# Patient Record
Sex: Male | Born: 1945 | Race: White | Hispanic: No | Marital: Married | Smoking: Current every day smoker
Health system: Southern US, Community
[De-identification: ages and names within clinical notes are randomized; demographics above are authoritative.]

## PROBLEM LIST (undated history)

## (undated) DIAGNOSIS — Z955 Presence of coronary angioplasty implant and graft: Secondary | ICD-10-CM

## (undated) DIAGNOSIS — M549 Dorsalgia, unspecified: Secondary | ICD-10-CM

## (undated) DIAGNOSIS — E876 Hypokalemia: Secondary | ICD-10-CM

## (undated) DIAGNOSIS — J181 Lobar pneumonia, unspecified organism: Secondary | ICD-10-CM

## (undated) DIAGNOSIS — S0990XA Unspecified injury of head, initial encounter: Secondary | ICD-10-CM

## (undated) DIAGNOSIS — R0600 Dyspnea, unspecified: Secondary | ICD-10-CM

## (undated) DIAGNOSIS — F419 Anxiety disorder, unspecified: Secondary | ICD-10-CM

## (undated) DIAGNOSIS — I25119 Atherosclerotic heart disease of native coronary artery with unspecified angina pectoris: Secondary | ICD-10-CM

## (undated) DIAGNOSIS — I2510 Atherosclerotic heart disease of native coronary artery without angina pectoris: Secondary | ICD-10-CM

## (undated) DIAGNOSIS — Z720 Tobacco use: Secondary | ICD-10-CM

## (undated) DIAGNOSIS — G8929 Other chronic pain: Secondary | ICD-10-CM

## (undated) DIAGNOSIS — R55 Syncope and collapse: Secondary | ICD-10-CM

## (undated) DIAGNOSIS — G44011 Episodic cluster headache, intractable: Secondary | ICD-10-CM

## (undated) DIAGNOSIS — Z9119 Patient's noncompliance with other medical treatment and regimen: Secondary | ICD-10-CM

## (undated) DIAGNOSIS — I429 Cardiomyopathy, unspecified: Secondary | ICD-10-CM

## (undated) DIAGNOSIS — I5020 Unspecified systolic (congestive) heart failure: Secondary | ICD-10-CM

## (undated) DIAGNOSIS — I471 Supraventricular tachycardia: Secondary | ICD-10-CM

## (undated) DIAGNOSIS — G4700 Insomnia, unspecified: Secondary | ICD-10-CM

## (undated) DIAGNOSIS — K219 Gastro-esophageal reflux disease without esophagitis: Secondary | ICD-10-CM

## (undated) DIAGNOSIS — E7800 Pure hypercholesterolemia, unspecified: Secondary | ICD-10-CM

## (undated) DIAGNOSIS — G4459 Other complicated headache syndrome: Secondary | ICD-10-CM

## (undated) DIAGNOSIS — E785 Hyperlipidemia, unspecified: Secondary | ICD-10-CM

## (undated) DIAGNOSIS — I509 Heart failure, unspecified: Secondary | ICD-10-CM

## (undated) DIAGNOSIS — I444 Left anterior fascicular block: Secondary | ICD-10-CM

## (undated) DIAGNOSIS — I10 Essential (primary) hypertension: Secondary | ICD-10-CM

## (undated) HISTORY — DX: Presence of coronary angioplasty implant and graft: Z95.5

## (undated) HISTORY — DX: Heart failure, unspecified: I50.9

## (undated) HISTORY — DX: Syncope and collapse: R55

## (undated) HISTORY — DX: Essential (primary) hypertension: I10

## (undated) HISTORY — DX: Supraventricular tachycardia: I47.1

## (undated) HISTORY — DX: Anxiety disorder, unspecified: F41.9

## (undated) HISTORY — DX: Cardiomyopathy, unspecified: I42.9

## (undated) HISTORY — DX: Atherosclerotic heart disease of native coronary artery with unspecified angina pectoris: I25.119

## (undated) HISTORY — PX: BACK SURGERY: SHX140

## (undated) HISTORY — DX: Other complicated headache syndrome: G44.59

## (undated) HISTORY — PX: CATARACT EXTRACTION: SUR2

## (undated) HISTORY — DX: Left anterior fascicular block: I44.4

## (undated) HISTORY — DX: Hyperlipidemia, unspecified: E78.5

## (undated) HISTORY — DX: Patient's noncompliance with other medical treatment and regimen: Z91.19

## (undated) HISTORY — DX: Lobar pneumonia, unspecified organism: J18.1

## (undated) HISTORY — DX: Hypokalemia: E87.6

## (undated) HISTORY — DX: Gastro-esophageal reflux disease without esophagitis: K21.9

## (undated) HISTORY — DX: Unspecified injury of head, initial encounter: S09.90XA

## (undated) HISTORY — DX: Insomnia, unspecified: G47.00

## (undated) HISTORY — PX: SHOULDER SURGERY: SHX246

## (undated) HISTORY — PX: KNEE SURGERY: SHX244

## (undated) HISTORY — DX: Episodic cluster headache, intractable: G44.011

## (undated) HISTORY — DX: Atherosclerotic heart disease of native coronary artery without angina pectoris: I25.10

---

## 2002-02-13 ENCOUNTER — Inpatient Hospital Stay (HOSPITAL_COMMUNITY): Admission: AD | Admit: 2002-02-13 | Discharge: 2002-02-14 | Payer: Self-pay | Admitting: Cardiology

## 2005-01-28 ENCOUNTER — Ambulatory Visit: Payer: Self-pay | Admitting: Family Medicine

## 2015-01-06 DIAGNOSIS — H25011 Cortical age-related cataract, right eye: Secondary | ICD-10-CM | POA: Diagnosis not present

## 2015-01-06 DIAGNOSIS — H2511 Age-related nuclear cataract, right eye: Secondary | ICD-10-CM | POA: Diagnosis not present

## 2015-01-07 DIAGNOSIS — H269 Unspecified cataract: Secondary | ICD-10-CM | POA: Diagnosis not present

## 2015-01-07 DIAGNOSIS — E785 Hyperlipidemia, unspecified: Secondary | ICD-10-CM | POA: Diagnosis not present

## 2015-01-07 DIAGNOSIS — I119 Hypertensive heart disease without heart failure: Secondary | ICD-10-CM | POA: Diagnosis not present

## 2015-01-07 DIAGNOSIS — Z720 Tobacco use: Secondary | ICD-10-CM | POA: Diagnosis not present

## 2015-01-07 DIAGNOSIS — Z01818 Encounter for other preprocedural examination: Secondary | ICD-10-CM | POA: Diagnosis not present

## 2015-01-07 DIAGNOSIS — I2510 Atherosclerotic heart disease of native coronary artery without angina pectoris: Secondary | ICD-10-CM | POA: Diagnosis not present

## 2015-01-07 DIAGNOSIS — F17210 Nicotine dependence, cigarettes, uncomplicated: Secondary | ICD-10-CM | POA: Diagnosis not present

## 2015-01-13 DIAGNOSIS — R8290 Unspecified abnormal findings in urine: Secondary | ICD-10-CM | POA: Diagnosis not present

## 2015-01-13 DIAGNOSIS — Z720 Tobacco use: Secondary | ICD-10-CM | POA: Diagnosis not present

## 2015-01-20 DIAGNOSIS — F17210 Nicotine dependence, cigarettes, uncomplicated: Secondary | ICD-10-CM | POA: Diagnosis not present

## 2015-01-20 DIAGNOSIS — I119 Hypertensive heart disease without heart failure: Secondary | ICD-10-CM | POA: Diagnosis not present

## 2015-01-20 DIAGNOSIS — E785 Hyperlipidemia, unspecified: Secondary | ICD-10-CM | POA: Diagnosis not present

## 2015-01-20 DIAGNOSIS — I2510 Atherosclerotic heart disease of native coronary artery without angina pectoris: Secondary | ICD-10-CM | POA: Diagnosis not present

## 2015-01-23 DIAGNOSIS — H25811 Combined forms of age-related cataract, right eye: Secondary | ICD-10-CM | POA: Diagnosis not present

## 2015-01-23 DIAGNOSIS — H2511 Age-related nuclear cataract, right eye: Secondary | ICD-10-CM | POA: Diagnosis not present

## 2015-04-08 DIAGNOSIS — M7989 Other specified soft tissue disorders: Secondary | ICD-10-CM | POA: Diagnosis not present

## 2015-04-08 DIAGNOSIS — M79641 Pain in right hand: Secondary | ICD-10-CM | POA: Diagnosis not present

## 2015-07-03 DIAGNOSIS — Z0000 Encounter for general adult medical examination without abnormal findings: Secondary | ICD-10-CM | POA: Diagnosis not present

## 2015-07-03 DIAGNOSIS — R079 Chest pain, unspecified: Secondary | ICD-10-CM | POA: Diagnosis not present

## 2015-07-03 DIAGNOSIS — I119 Hypertensive heart disease without heart failure: Secondary | ICD-10-CM | POA: Diagnosis not present

## 2015-07-03 DIAGNOSIS — E785 Hyperlipidemia, unspecified: Secondary | ICD-10-CM | POA: Diagnosis not present

## 2015-07-03 DIAGNOSIS — Z136 Encounter for screening for cardiovascular disorders: Secondary | ICD-10-CM | POA: Diagnosis not present

## 2015-07-03 DIAGNOSIS — Z720 Tobacco use: Secondary | ICD-10-CM | POA: Diagnosis not present

## 2015-07-03 DIAGNOSIS — I2510 Atherosclerotic heart disease of native coronary artery without angina pectoris: Secondary | ICD-10-CM | POA: Diagnosis not present

## 2015-07-03 DIAGNOSIS — F17218 Nicotine dependence, cigarettes, with other nicotine-induced disorders: Secondary | ICD-10-CM | POA: Diagnosis not present

## 2015-07-03 DIAGNOSIS — Z125 Encounter for screening for malignant neoplasm of prostate: Secondary | ICD-10-CM | POA: Diagnosis not present

## 2015-07-03 DIAGNOSIS — G4762 Sleep related leg cramps: Secondary | ICD-10-CM | POA: Diagnosis not present

## 2015-07-08 DIAGNOSIS — Z1211 Encounter for screening for malignant neoplasm of colon: Secondary | ICD-10-CM | POA: Diagnosis not present

## 2015-07-22 DIAGNOSIS — S80862A Insect bite (nonvenomous), left lower leg, initial encounter: Secondary | ICD-10-CM | POA: Diagnosis not present

## 2015-07-22 DIAGNOSIS — W57XXXA Bitten or stung by nonvenomous insect and other nonvenomous arthropods, initial encounter: Secondary | ICD-10-CM | POA: Diagnosis not present

## 2015-07-22 DIAGNOSIS — S80861A Insect bite (nonvenomous), right lower leg, initial encounter: Secondary | ICD-10-CM | POA: Diagnosis not present

## 2015-07-24 DIAGNOSIS — R079 Chest pain, unspecified: Secondary | ICD-10-CM | POA: Diagnosis not present

## 2015-07-31 DIAGNOSIS — M79605 Pain in left leg: Secondary | ICD-10-CM | POA: Diagnosis not present

## 2015-07-31 DIAGNOSIS — M79604 Pain in right leg: Secondary | ICD-10-CM | POA: Diagnosis not present

## 2015-07-31 DIAGNOSIS — G4762 Sleep related leg cramps: Secondary | ICD-10-CM | POA: Diagnosis not present

## 2015-09-09 ENCOUNTER — Emergency Department (HOSPITAL_COMMUNITY): Payer: Medicare Other

## 2015-09-09 ENCOUNTER — Emergency Department (HOSPITAL_COMMUNITY)
Admission: EM | Admit: 2015-09-09 | Discharge: 2015-09-09 | Disposition: A | Discharge status: Home / Self Care | Payer: Medicare Other | Attending: Emergency Medicine | Admitting: Emergency Medicine

## 2015-09-09 ENCOUNTER — Encounter (HOSPITAL_COMMUNITY): Payer: Self-pay | Admitting: Emergency Medicine

## 2015-09-09 DIAGNOSIS — Z720 Tobacco use: Secondary | ICD-10-CM | POA: Insufficient documentation

## 2015-09-09 DIAGNOSIS — R0789 Other chest pain: Secondary | ICD-10-CM | POA: Diagnosis not present

## 2015-09-09 DIAGNOSIS — Z7982 Long term (current) use of aspirin: Secondary | ICD-10-CM | POA: Diagnosis not present

## 2015-09-09 DIAGNOSIS — J209 Acute bronchitis, unspecified: Secondary | ICD-10-CM | POA: Diagnosis not present

## 2015-09-09 DIAGNOSIS — J40 Bronchitis, not specified as acute or chronic: Secondary | ICD-10-CM

## 2015-09-09 DIAGNOSIS — R079 Chest pain, unspecified: Secondary | ICD-10-CM | POA: Diagnosis not present

## 2015-09-09 DIAGNOSIS — R05 Cough: Secondary | ICD-10-CM | POA: Diagnosis not present

## 2015-09-09 DIAGNOSIS — I2510 Atherosclerotic heart disease of native coronary artery without angina pectoris: Secondary | ICD-10-CM | POA: Diagnosis not present

## 2015-09-09 DIAGNOSIS — F17200 Nicotine dependence, unspecified, uncomplicated: Secondary | ICD-10-CM | POA: Diagnosis not present

## 2015-09-09 DIAGNOSIS — R0602 Shortness of breath: Secondary | ICD-10-CM | POA: Diagnosis not present

## 2015-09-09 HISTORY — DX: Atherosclerotic heart disease of native coronary artery without angina pectoris: I25.10

## 2015-09-09 LAB — BASIC METABOLIC PANEL
Anion gap: 9 (ref 5–15)
BUN: 9 mg/dL (ref 6–20)
CALCIUM: 9.1 mg/dL (ref 8.9–10.3)
CO2: 24 mmol/L (ref 22–32)
CREATININE: 0.92 mg/dL (ref 0.61–1.24)
Chloride: 105 mmol/L (ref 101–111)
GFR calc Af Amer: >60 mL/min (ref >60)
GLUCOSE: 129 mg/dL — AB (ref 65–99)
POTASSIUM: 3.9 mmol/L (ref 3.5–5.1)
SODIUM: 138 mmol/L (ref 135–145)

## 2015-09-09 LAB — CBC WITH DIFFERENTIAL/PLATELET
BASOS ABS: 0 10*3/uL (ref 0.0–0.1)
Basophils Relative: 0 %
EOS ABS: 0.1 10*3/uL (ref 0.0–0.7)
EOS PCT: 2 %
HCT: 45 % (ref 39.0–52.0)
Hemoglobin: 15.7 g/dL (ref 13.0–17.0)
LYMPHS PCT: 24 %
Lymphs Abs: 1.7 10*3/uL (ref 0.7–4.0)
MCH: 31.8 pg (ref 26.0–34.0)
MCHC: 34.9 g/dL (ref 30.0–36.0)
MCV: 91.3 fL (ref 78.0–100.0)
MONO ABS: 0.4 10*3/uL (ref 0.1–1.0)
Monocytes Relative: 5 %
Neutro Abs: 4.9 10*3/uL (ref 1.7–7.7)
Neutrophils Relative %: 69 %
PLATELETS: 164 10*3/uL (ref 150–400)
RBC: 4.93 MIL/uL (ref 4.22–5.81)
RDW: 13.7 % (ref 11.5–15.5)
WBC: 7.1 10*3/uL (ref 4.0–10.5)

## 2015-09-09 LAB — I-STAT TROPONIN, ED
TROPONIN I, POC: 0 ng/mL (ref 0.00–0.08)
TROPONIN I, POC: 0 ng/mL (ref 0.00–0.08)

## 2015-09-09 MED ORDER — AZITHROMYCIN 250 MG PO TABS: 250.0000 mg | ORAL_TABLET | Freq: Every day | ORAL | Status: DC

## 2015-09-09 MED ORDER — ALBUTEROL SULFATE HFA 108 (90 BASE) MCG/ACT IN AERS
1.0000 | INHALATION_SPRAY | Freq: Four times a day (QID) | RESPIRATORY_TRACT | Status: DC | PRN
  Administered 2015-09-09: 2 via RESPIRATORY_TRACT
  Filled 2015-09-09: qty 6.7

## 2015-09-09 NOTE — ED Notes (Signed)
Pt sts generalized CP x 3 days worse with cough; pt sts some SOB

## 2015-09-09 NOTE — ED Notes (Signed)
Pt. Left with all belongings and refused wheelchair. Discharge instructions were reviewed and all questions were answered.  

## 2015-09-09 NOTE — Discharge Instructions (Signed)
Bronchitis  Bronchitis is inflammation of the airways that extend from the windpipe into the lungs (bronchi). The inflammation often causes mucus to develop. This leads to a cough, which is the most common symptom of bronchitis.   In acute bronchitis, the condition usually develops suddenly and goes away over time, usually in a couple weeks. Smoking, allergies, and asthma can make bronchitis worse. Repeated episodes of bronchitis may cause further lung problems.  CAUSES  Acute bronchitis is most often caused by the same virus that causes a cold. The virus can spread from person to person (contagious) through coughing, sneezing, and touching contaminated objects.  SIGNS AND SYMPTOMS  Cough.  Fever.  Coughing up mucus.  Body aches.  Chest congestion.  Chills.  Shortness of breath.  Sore throat.  DIAGNOSIS  Acute bronchitis is usually diagnosed through a physical exam. Your health care provider will also ask you questions about your medical history. Tests, such as chest X-rays, are sometimes done to rule out other conditions.  TREATMENT  Acute bronchitis usually goes away in a couple weeks. Oftentimes, no medical treatment is necessary. Medicines are sometimes given for relief of fever or cough. Antibiotic medicines are usually not needed but may be prescribed in certain situations. In some cases, an inhaler may be recommended to help reduce shortness of breath and control the cough. A cool mist vaporizer may also be used to help thin bronchial secretions and make it easier to clear the chest.  HOME CARE INSTRUCTIONS  Get plenty of rest.  Drink enough fluids to keep your urine clear or pale yellow (unless you have a medical condition that requires fluid restriction). Increasing fluids may help thin your respiratory secretions (sputum) and reduce chest congestion, and it will prevent dehydration.  Take medicines only as directed by your health care provider.  If you were prescribed an antibiotic  medicine, finish it all even if you start to feel better.  Avoid smoking and secondhand smoke. Exposure to cigarette smoke or irritating chemicals will make bronchitis worse. If you are a smoker, consider using nicotine gum or skin patches to help control withdrawal symptoms. Quitting smoking will help your lungs heal faster.  Reduce the chances of another bout of acute bronchitis by washing your hands frequently, avoiding people with cold symptoms, and trying not to touch your hands to your mouth, nose, or eyes.  Keep all follow-up visits as directed by your health care provider.  SEEK MEDICAL CARE IF:  Your symptoms do not improve after 1 week of treatment.  SEEK IMMEDIATE MEDICAL CARE IF:  You develop an increased fever or chills.  You have chest pain.  You have severe shortness of breath.  You have bloody sputum.  You develop dehydration.  You faint or repeatedly feel like you are going to pass out.  You develop repeated vomiting.  You develop a severe headache. MAKE SURE YOU:  Understand these instructions.  Will watch your condition.  Will get help right away if you are not doing well or get worse. Document Released: 01/13/2005 Document Revised: 04/22/2014 Document Reviewed: 05/29/2013  [facility] Patient Information 2015 [place], [place]. This information is not intended to replace advice given to you by your health care provider. Make sure you discuss any questions you have with your health care provider.

## 2015-09-09 NOTE — ED Provider Notes (Signed)
CSN: [id number]     Arrival date & time 09/09/15  1048 History   First MD Initiated Contact with Patient 09/09/15 1351     Chief Complaint  Patient presents with  . Chest Pain     (Consider location/radiation/quality/duration/timing/severity/associated sxs/prior Treatment) HPI   Craig Russell is a 69 y.o. male with PMH significant for CAD, cardiac cath (2003) with stent placement, and recent stress test (07/24/15) who presents with substernal gradually worsening constant sharp, pressure-like chest pain that began this morning when he woke up.  He states he has had a cough for the past couple of weeks with increased sputum production. Exertion, movement, and cough make it worse.  Nothing makes it better.  No history of CVA.  He smokes 1.5 ppd and has a 99 pack year history.  He currently takes pravastatin and ASA 81 mg QD.  He has SL nitroglycerin; however, he did not take any this morning.  He has a PCP in [place].  He is unsure of his stress test results (07/24/15).   Past Medical History  Diagnosis Date  . Coronary artery disease    History reviewed. No pertinent past surgical history. History reviewed. No pertinent family history. Social History  Substance Use Topics  . Smoking status: Current Every Day Smoker  . Smokeless tobacco: None  . Alcohol Use: No    Review of Systems All other systems negative unless otherwise stated in HPI    Allergies  Review of patient's allergies indicates no known allergies.  Home Medications   Prior to Admission medications   Medication Sig Start Date End Date Taking? Authorizing Provider  aspirin 81 MG chewable tablet Chew 81 mg by mouth daily.   Yes Historical Provider, MD  nitroGLYCERIN (NITROSTAT) 0.4 MG SL tablet Place 0.4 mg under the tongue every 5 (five) minutes as needed for chest pain.   Yes Historical Provider, MD  pravastatin (PRAVACHOL) 10 MG tablet Take 10 mg by mouth at bedtime.   Yes Historical Provider, MD   BP 130/79 mmHg   Pulse 57  Temp(Src) 97.8 F (36.6 C) (Oral)  Resp 14  SpO2 96% Physical Exam  Constitutional: He is oriented to person, place, and time. He appears well-developed and well-nourished.  HENT:  Head: Normocephalic and atraumatic.  Mouth/Throat: Oropharynx is clear and moist.  Eyes: Pupils are equal, round, and reactive to light.  Neck: Normal range of motion. Neck supple.  Cardiovascular: Normal rate, regular rhythm and normal heart sounds.   No murmur heard. Pulmonary/Chest: Effort normal and breath sounds normal. No respiratory distress. He has no wheezes. He has no rales.    Abdominal: Soft. Bowel sounds are normal. He exhibits no distension. There is no tenderness.  Musculoskeletal: Normal range of motion.  Lymphadenopathy:    He has no cervical adenopathy.  Neurological: He is alert and oriented to person, place, and time.  Skin: Skin is warm and dry.  Psychiatric: He has a normal mood and affect. His behavior is normal.    ED Course  Procedures (including critical care time) Labs Review Labs Reviewed  BASIC METABOLIC PANEL - Abnormal; Notable for the following:    Glucose, Bld 129 (*)    All other components within normal limits  CBC WITH DIFFERENTIAL/PLATELET  Randolm Idol, ED    Imaging Review Dg Chest 2 View  09/09/2015   CLINICAL DATA:  Chest pain, shortness of breath, smoker. Nausea, cough.  EXAM: CHEST  2 VIEW  COMPARISON:  01/07/2015  FINDINGS: The  heart size and mediastinal contours are within normal limits. Both lungs are clear. The visualized skeletal structures are unremarkable.  IMPRESSION: No active cardiopulmonary disease.   Electronically Signed   By: Rolm Baptise M.D.   On: 09/09/2015 11:58   I have personally reviewed and evaluated these images and lab results as part of my medical decision-making.   EKG Interpretation None      MDM   Final diagnoses:  None    Patient presents with gradually worsening chest pain.  VSS, patient appears  nontoxic, NAD.  On exam, TTP along anterior chest wall and sternum.  Lungs CTAB.  Heart sounds nl.    Imaging includes CXR which shows no active cardiopulmonary disease.  Labs include CBC, troponin, and BMP.  EKG is unchanged from previous.     Concern for cardiac etiology vs noncardiac chest pain vs bronchitis.  HEART score 4.  Will perform serial troponins.  I-Stat troponin at 11:43 AM negative.  Will check again at 2:45 PM.  Second troponin is negative.  Suspect bronchitis.  Will d/c home with [employer] if serial troponin negative and follow up with his cardiologist this week. Pt stable for d/c.  Discussed return precautions and supportive care.  Patient acknowledges and agrees with the above plan.  Case has been discussed with and seen by Dr. Maryan Rued who agrees with the above plan for discharge.     [name], PA-C 09/09/15 1619  [name], MD 09/13/15 2210

## 2015-09-11 DIAGNOSIS — H35033 Hypertensive retinopathy, bilateral: Secondary | ICD-10-CM | POA: Diagnosis not present

## 2015-11-27 DIAGNOSIS — M1990 Unspecified osteoarthritis, unspecified site: Secondary | ICD-10-CM | POA: Diagnosis not present

## 2015-11-27 DIAGNOSIS — M7989 Other specified soft tissue disorders: Secondary | ICD-10-CM | POA: Diagnosis not present

## 2015-11-27 DIAGNOSIS — Z23 Encounter for immunization: Secondary | ICD-10-CM | POA: Diagnosis not present

## 2015-11-27 DIAGNOSIS — M79641 Pain in right hand: Secondary | ICD-10-CM | POA: Diagnosis not present

## 2016-01-05 DIAGNOSIS — G4762 Sleep related leg cramps: Secondary | ICD-10-CM | POA: Diagnosis not present

## 2016-01-05 DIAGNOSIS — E785 Hyperlipidemia, unspecified: Secondary | ICD-10-CM | POA: Diagnosis not present

## 2016-01-05 DIAGNOSIS — I119 Hypertensive heart disease without heart failure: Secondary | ICD-10-CM | POA: Diagnosis not present

## 2016-01-05 DIAGNOSIS — F17218 Nicotine dependence, cigarettes, with other nicotine-induced disorders: Secondary | ICD-10-CM | POA: Diagnosis not present

## 2016-04-06 DIAGNOSIS — M19041 Primary osteoarthritis, right hand: Secondary | ICD-10-CM | POA: Diagnosis not present

## 2016-04-06 DIAGNOSIS — M79641 Pain in right hand: Secondary | ICD-10-CM | POA: Diagnosis not present

## 2016-04-19 DIAGNOSIS — I5020 Unspecified systolic (congestive) heart failure: Secondary | ICD-10-CM

## 2016-04-19 HISTORY — DX: Unspecified systolic (congestive) heart failure: I50.20

## 2016-05-09 ENCOUNTER — Observation Stay (HOSPITAL_COMMUNITY)
Admission: EM | Admit: 2016-05-09 | Discharge: 2016-05-11 | Disposition: A | Discharge status: Home / Self Care | Payer: Medicare Other | Attending: Internal Medicine | Admitting: Internal Medicine

## 2016-05-09 ENCOUNTER — Encounter (HOSPITAL_COMMUNITY): Payer: Self-pay

## 2016-05-09 ENCOUNTER — Emergency Department (HOSPITAL_COMMUNITY): Payer: Medicare Other

## 2016-05-09 DIAGNOSIS — R079 Chest pain, unspecified: Secondary | ICD-10-CM | POA: Diagnosis not present

## 2016-05-09 DIAGNOSIS — Z7982 Long term (current) use of aspirin: Secondary | ICD-10-CM | POA: Diagnosis not present

## 2016-05-09 DIAGNOSIS — E7800 Pure hypercholesterolemia, unspecified: Secondary | ICD-10-CM | POA: Diagnosis not present

## 2016-05-09 DIAGNOSIS — Z8249 Family history of ischemic heart disease and other diseases of the circulatory system: Secondary | ICD-10-CM | POA: Diagnosis not present

## 2016-05-09 DIAGNOSIS — F17210 Nicotine dependence, cigarettes, uncomplicated: Secondary | ICD-10-CM | POA: Diagnosis not present

## 2016-05-09 DIAGNOSIS — R1013 Epigastric pain: Secondary | ICD-10-CM | POA: Diagnosis not present

## 2016-05-09 DIAGNOSIS — R0789 Other chest pain: Principal | ICD-10-CM | POA: Insufficient documentation

## 2016-05-09 DIAGNOSIS — F17200 Nicotine dependence, unspecified, uncomplicated: Secondary | ICD-10-CM | POA: Diagnosis not present

## 2016-05-09 DIAGNOSIS — Z9114 Patient's other noncompliance with medication regimen: Secondary | ICD-10-CM | POA: Insufficient documentation

## 2016-05-09 DIAGNOSIS — I2510 Atherosclerotic heart disease of native coronary artery without angina pectoris: Secondary | ICD-10-CM

## 2016-05-09 DIAGNOSIS — R001 Bradycardia, unspecified: Secondary | ICD-10-CM | POA: Diagnosis not present

## 2016-05-09 DIAGNOSIS — J410 Simple chronic bronchitis: Secondary | ICD-10-CM | POA: Insufficient documentation

## 2016-05-09 DIAGNOSIS — I10 Essential (primary) hypertension: Secondary | ICD-10-CM | POA: Insufficient documentation

## 2016-05-09 DIAGNOSIS — Z955 Presence of coronary angioplasty implant and graft: Secondary | ICD-10-CM

## 2016-05-09 DIAGNOSIS — R51 Headache: Secondary | ICD-10-CM | POA: Diagnosis not present

## 2016-05-09 DIAGNOSIS — I25119 Atherosclerotic heart disease of native coronary artery with unspecified angina pectoris: Secondary | ICD-10-CM | POA: Diagnosis not present

## 2016-05-09 DIAGNOSIS — Z79899 Other long term (current) drug therapy: Secondary | ICD-10-CM | POA: Insufficient documentation

## 2016-05-09 DIAGNOSIS — Z66 Do not resuscitate: Secondary | ICD-10-CM | POA: Insufficient documentation

## 2016-05-09 DIAGNOSIS — E785 Hyperlipidemia, unspecified: Secondary | ICD-10-CM

## 2016-05-09 DIAGNOSIS — Z720 Tobacco use: Secondary | ICD-10-CM

## 2016-05-09 HISTORY — DX: Pure hypercholesterolemia, unspecified: E78.00

## 2016-05-09 HISTORY — DX: Tobacco use: Z72.0

## 2016-05-09 HISTORY — DX: Presence of coronary angioplasty implant and graft: Z95.5

## 2016-05-09 HISTORY — DX: Hyperlipidemia, unspecified: E78.5

## 2016-05-09 HISTORY — DX: Atherosclerotic heart disease of native coronary artery without angina pectoris: I25.10

## 2016-05-09 LAB — CBC
HCT: 44.6 % (ref 39.0–52.0)
Hemoglobin: 14.9 g/dL (ref 13.0–17.0)
MCH: 30 pg (ref 26.0–34.0)
MCHC: 33.4 g/dL (ref 30.0–36.0)
MCV: 89.9 fL (ref 78.0–100.0)
PLATELETS: 188 10*3/uL (ref 150–400)
RBC: 4.96 MIL/uL (ref 4.22–5.81)
RDW: 13.4 % (ref 11.5–15.5)
WBC: 5.4 10*3/uL (ref 4.0–10.5)

## 2016-05-09 LAB — BASIC METABOLIC PANEL
Anion gap: 8 (ref 5–15)
BUN: 7 mg/dL (ref 6–20)
CALCIUM: 9.1 mg/dL (ref 8.9–10.3)
CO2: 27 mmol/L (ref 22–32)
CREATININE: 0.98 mg/dL (ref 0.61–1.24)
Chloride: 104 mmol/L (ref 101–111)
GFR calc non Af Amer: >60 mL/min (ref >60)
Glucose, Bld: 87 mg/dL (ref 65–99)
Potassium: 3.6 mmol/L (ref 3.5–5.1)
Sodium: 139 mmol/L (ref 135–145)

## 2016-05-09 LAB — I-STAT TROPONIN, ED: TROPONIN I, POC: 0 ng/mL (ref 0.00–0.08)

## 2016-05-09 MED ORDER — NICOTINE 21 MG/24HR TD PT24
21.0000 mg | MEDICATED_PATCH | Freq: Every day | TRANSDERMAL | Status: DC
  Administered 2016-05-10 – 2016-05-11 (×3): 21 mg via TRANSDERMAL
  Filled 2016-05-09 (×3): qty 1

## 2016-05-09 MED ORDER — ATORVASTATIN CALCIUM 40 MG PO TABS
40.0000 mg | ORAL_TABLET | Freq: Every day | ORAL | Status: DC
  Administered 2016-05-10 – 2016-05-11 (×3): 40 mg via ORAL
  Filled 2016-05-09 (×3): qty 1

## 2016-05-09 MED ORDER — LISINOPRIL 10 MG PO TABS
5.0000 mg | ORAL_TABLET | Freq: Every day | ORAL | Status: DC
  Administered 2016-05-10 – 2016-05-11 (×2): 5 mg via ORAL
  Filled 2016-05-09 (×2): qty 1

## 2016-05-09 MED ORDER — NITROGLYCERIN 0.4 MG SL SUBL: 0.4000 mg | SUBLINGUAL_TABLET | SUBLINGUAL | Status: DC | PRN

## 2016-05-09 MED ORDER — ATORVASTATIN CALCIUM 40 MG PO TABS: 40.0000 mg | ORAL_TABLET | Freq: Every day | ORAL | Status: DC

## 2016-05-09 NOTE — H&P (Signed)
Date: 05/09/2016               Patient Name:  Craig Russell MRN: [id number]  DOB: Jun 08, 1946 Age / Sex: 70 y.o., male   PCP: No primary care provider on file.         Medical Service: Internal Medicine Teaching Service         Attending Physician: Dr. Gilles Chiquito, MD    First Contact: Dr. Jule Ser Pager: [phone]  Second Contact: Dr. Dellia Nims Pager: [phone]       After Hours (After 5p/  First Contact Pager: [phone]  weekends / holidays): Second Contact Pager: [phone]   Chief Complaint: Chest pain  History of Present Illness: Craig Russell is a 70 year old gentleman with PMH of CAD s/p cath 2 stents in 2002, HLD, and significant smoking history ( 1.5-2 PPD since age 49) who presents with chest pain. Patient states he was in his normal state of health until 3 AM yesterday morning when he was woken from sleep with a sharp constant right sided chest pain which radiated diagonally down to his left upper abdomen and up to his left chest. He reports tingling down his left arm and a burning sensation at his left neck. He had no associated diaphoresis, focal weakness, nausea, vomiting, SOB, headache, change in vision, loss of consciousness or radiation of his pain to the back. He feels his pain was worse with exertion. He took aleve for his pain which resolved after about 40 minutes. Patient felt well this morning, able to attend church and returned home. He says this pain returned while he was sitting on the couch and decided to come to the ED to be evaluated. He notes a decrease in energy of the last 2-3 months, but no limitation to ADLs. He has no pain when seen in the ED.  He carries a bottle of nitroglycerin in his pocket, but does not take it as it gave him a headache once about 10 years ago. He says he has not taken any medications in at least 6 months, including his home aspirin and statin. He had a similar episode for which he was seen in the ED in September 2016, but no other  chest pain since his cardiac cath. He has a 120 pack year smoking history and is resistant to quitting. He has a chronic smoker's cough. 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 He reports a history of CHF in his brother. He has a PCP in [place].  Meds: Current Facility-Administered Medications  Medication Dose Route Frequency Provider Last Rate Last Dose  . atorvastatin (LIPITOR) tablet 40 mg  40 mg Oral q1800 Juliet Rude, MD      . Derrill Memo ON 05/10/2016] lisinopril (PRINIVIL,ZESTRIL) tablet 5 mg  5 mg Oral Daily Zada Finders, MD      . nicotine (NICODERM CQ - dosed in mg/24 hours) patch 21 mg  21 mg Transdermal Daily Carly J Rivet, MD      . nitroGLYCERIN (NITROSTAT) SL tablet 0.4 mg  0.4 mg Sublingual Q5 min PRN Juliet Rude, MD       Current Outpatient Prescriptions  Medication Sig Dispense Refill  . aspirin 81 MG chewable tablet Chew 81 mg by mouth daily. Reported on 05/09/2016    . azithromycin (ZITHROMAX) 250 MG tablet Take 1 tablet (250 mg total) by mouth daily. Take first 2 tablets together, then 1 every day  until finished. 6 tablet 0  . busPIRone (BUSPAR) 15 MG tablet Take 15 mg by mouth 2 (two) times daily. Reported on 05/09/2016    . nitroGLYCERIN (NITROSTAT) 0.4 MG SL tablet Place 0.4 mg under the tongue every 5 (five) minutes as needed for chest pain. Reported on 05/09/2016    . pravastatin (PRAVACHOL) 10 MG tablet Take 10 mg by mouth at bedtime. Reported on 05/09/2016      Allergies: Allergies as of 05/09/2016  . (No Known Allergies)   Past Medical History  Diagnosis Date  . Coronary artery disease     s/p 2 stents in 2002  . High cholesterol   . Tobacco abuse    History reviewed. No pertinent past surgical history. Family History  Problem Relation Age of Onset  . Cancer Sister     "brain tumors"  . Heart failure Brother    Social History   Social History  . Marital Status: Married    Spouse Name: N/A  . Number of  Children: N/A  . Years of Education: N/A   Occupational History  . Not on file.   Social History Main Topics  . Smoking status: Current Every Day Smoker -- 2.00 packs/day for 60 years    Types: Cigarettes  . Smokeless tobacco: Not on file  . Alcohol Use: No  . Drug Use: No  . Sexual Activity: Not on file   Other Topics Concern  . 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       Decreased energy for 2 months  Eyes:       Occasional blurry vision  Respiratory: Positive for cough and sputum production. Negative for hemoptysis and shortness of breath.   Cardiovascular: Positive for chest pain. Negative for palpitations, orthopnea and leg swelling.  Gastrointestinal: Negative for heartburn, nausea, vomiting, abdominal pain, diarrhea and constipation.  Genitourinary: Negative for dysuria.  Musculoskeletal: Positive for neck pain. Negative for back pain and falls.       Burning left sided neck pain. Muscle cramping bilateral lower extremities  Neurological: Positive for dizziness and tingling. Negative for sensory change, focal weakness, loss of consciousness, weakness and headaches.       Tingling down left arm     Physical Exam: Blood pressure 131/78, pulse 63, temperature 97.6 F (36.4 C), temperature source Oral, resp. rate 14, height 5\' 7"  (1.702 m), weight 160 lb (72.576 kg), SpO2 97 %. Physical Exam  Constitutional: He is oriented to person, place, and time. He appears well-developed and well-nourished. No distress.  HENT:  Head: Normocephalic and atraumatic.  Mouth/Throat: Oropharynx is clear and moist.  Eyes: EOM are normal.  Neck: Normal range of motion. Normal carotid pulses present. Carotid bruit is not present.  Cardiovascular: Regular rhythm and intact distal pulses.   Bradycardia present.   Pulmonary/Chest: Effort normal. No respiratory distress. He exhibits no tenderness.  Distant breath sounds  Abdominal: Soft. Bowel sounds are normal. He exhibits no distension.  Mild LUQ tenderness   Musculoskeletal: Normal range of motion. He exhibits no edema or tenderness.  Neurological: He is alert and oriented to person, place, and time. He has normal strength. He displays no tremor. No cranial nerve deficit or sensory deficit. He exhibits normal muscle tone. Coordination normal.  Skin: Skin  is warm. He is not diaphoretic.  Psychiatric: He has a normal mood and affect.     Lab results: Basic Metabolic Panel:  Recent Labs  05/09/16 1721  NA 139  K 3.6  CL 104  CO2 27  GLUCOSE 87  BUN 7  CREATININE 0.98  CALCIUM 9.1   Liver Function Tests: No results for input(s): AST, ALT, ALKPHOS, BILITOT, PROT, ALBUMIN in the last 72 hours. No results for input(s): LIPASE, AMYLASE in the last 72 hours. No results for input(s): AMMONIA in the last 72 hours. CBC:  Recent Labs  05/09/16 1721  WBC 5.4  HGB 14.9  HCT 44.6  MCV 89.9  PLT 188   Cardiac Enzymes: No results for input(s): CKTOTAL, CKMB, CKMBINDEX, TROPONINI in the last 72 hours. BNP: No results for input(s): PROBNP in the last 72 hours. D-Dimer: No results for input(s): DDIMER in the last 72 hours. CBG: No results for input(s): GLUCAP in the last 72 hours. Hemoglobin A1C: No results for input(s): HGBA1C in the last 72 hours. Fasting Lipid Panel: No results for input(s): CHOL, HDL, LDLCALC, TRIG, CHOLHDL, LDLDIRECT in the last 72 hours. Thyroid Function Tests: No results for input(s): TSH, T4TOTAL, FREET4, T3FREE, THYROIDAB in the last 72 hours. Anemia Panel: No results for input(s): VITAMINB12, FOLATE, FERRITIN, TIBC, IRON, RETICCTPCT in the last 72 hours. Coagulation: No results for input(s): LABPROT, INR in the last 72 hours. Urine Drug Screen: Drugs of Abuse  No results found for:  LABOPIA, COCAINSCRNUR, LABBENZ, AMPHETMU, THCU, LABBARB  Alcohol Level: No results for input(s): ETH in the last 72 hours. Urinalysis: No results for input(s): COLORURINE, LABSPEC, PHURINE, GLUCOSEU, HGBUR, BILIRUBINUR, KETONESUR, PROTEINUR, UROBILINOGEN, NITRITE, LEUKOCYTESUR in the last 72 hours.  Invalid input(s): APPERANCEUR  Imaging results:  Dg Chest 2 View  05/09/2016  CLINICAL DATA:  A left chest pain starting at 3 a.m. EXAM: CHEST  2 VIEW COMPARISON:  09/09/2015 FINDINGS: Atherosclerotic aortic arch. A heart size within normal limits. A large lung volumes raise the possibility of emphysema. No acute findings. IMPRESSION: 1. Large lung volumes raise the possibility of emphysema. Atherosclerotic aortic arch. No acute findings. Electronically Signed   By: Van Clines M.D.   On: 05/09/2016 17:55    Other results: EKG: NSR, rate 66, LAFB, flattening of t-wave in III compared to prior, st elevation.  Assessment & Plan by Problem: Principal Problem:   Chest pain Active Problems:   CAD (coronary artery disease)   Tobacco abuse   Hyperlipidemia   S/P coronary artery stent placement  70 year old gentleman with PMH of CAD s/p cath 2 stents in 2002, HLD, and significant smoking history ( 1.5-2 PPD since age 57) who presents with chest pain.  Chest Pain: Patient with history of CAD s/p stenting, significant smoking history, HLD, and chest pain at rest. He has mixed typical and atypical symptoms and with his caridac history, risk factors, and non-adherence to medications, he warrants admission for chest pain rule out. Initial point of care troponin is negative. EKG without acute ischemic changes. CXR demonstrates atherosclerosis of the aortic arch and hyperexpanded lung fields suggestive of underlying COPD. He is currently asymptomatic. We will trend troponins and repeat EKG in the AM. It has been 15 years since his stents were placed and may warrant cardiac catheterization to evaluate  for restenosis versus stress testing. He will also require risk factor modification and medication adherence going forward. Should be on a beta-blocker, but holding for now given his bradycardia. -Trend  Troponins -Aspirin 81 mg daily -Start Atorvastatin 40 mg daily -Start Lisinopril 5 mg daily -Consider beta-blocker -Check Hgb A1C, Lipid panel -Sublingual nitroglycerin prn   CAD s/p stents: Patient reports 2 stents placed in 2002. He is non-adherent to home medications including aspirin and statin. May warrant repeat cardiac cath to evaluate for stenosis. -ASA, statin as above  HLD: Total Cholesterol 187, LDL 114, HDL 58, Triglycerides 78 on 07/03/2015. Non-adherent to home statin. -Start Atorvastatin 40 mg daily  Tobacco use:  Patient with at least a 120 pack year smoking history (1.5-2 PPD since age 54). He is resistant to quitting. Will need continued counseling and assistance to cut back and hopefully quit altogether. -Continue smoking cessation counseling -Nicotine patch   Diet: Heart   DVT ppx: Lovenox   Code: DNR (confirmed by patient)  Dispo: Disposition is deferred at this time, awaiting improvement of current medical problems. Anticipated discharge in approximately 1-2 day(s).   The patient does have a current PCP (No primary care provider on file.) and does not need an [facility] hospital follow-up appointment after discharge.  The patient does not have transportation limitations that hinder transportation to clinic appointments.  Signed: Zada Finders, MD 05/09/2016, 11:02 PM

## 2016-05-09 NOTE — ED Notes (Signed)
Patient states he has a bottle of nitroglycerin that he keeps in pockets at all times but he states that he doesn't take it because it gives him a headache.

## 2016-05-09 NOTE — ED Notes (Signed)
Patient returned from xray.

## 2016-05-09 NOTE — ED Provider Notes (Signed)
CSN: [id number]     Arrival date & time 05/09/16  1640 History   First MD Initiated Contact with Patient 05/09/16 1652     Chief Complaint  Patient presents with  . Chest Pain  . arm pain      (Consider location/radiation/quality/duration/timing/severity/associated sxs/prior Treatment) Patient is a 70 y.o. male presenting with chest pain. The history is provided by the patient, medical records and the spouse. No language interpreter was used.  Chest Pain Pain location:  L chest and R chest Pain quality: sharp   Pain radiates to:  L arm Pain radiates to the back: no   Pain severity:  Moderate Onset quality:  Sudden Duration:  1 day Timing:  Intermittent Progression:  Waxing and waning (Woke patient from sleep at 0400. Took Alleve around 0700 which relieved pain. Pain returned this evening while patient was sitting on the couch. ) Chronicity:  New Context comment:  Woke at 0400 with pain. Denies recent trauma, stress, cocaine or other drug use.  Relieved by:  Nothing Exacerbated by: lifting arms over head. Ineffective treatments: Alleve. Associated symptoms: no abdominal pain, no altered mental status, no back pain, no cough, no diaphoresis, no fever, no headache, no lower extremity edema, no nausea, no palpitations, no shortness of breath and not vomiting   Risk factors: coronary artery disease, high cholesterol (not taking medications), hypertension, male sex and smoking   Risk factors: no diabetes mellitus and no prior DVT/PE     Past Medical History  Diagnosis Date  . Coronary artery disease   . High cholesterol    History reviewed. No pertinent past surgical history. No family history on file. Social History  Substance Use Topics  . Smoking status: Current Every Day Smoker  . Smokeless tobacco: None  . Alcohol Use: No    Review of Systems  Constitutional: Negative for fever and diaphoresis.  HENT: Negative for congestion and rhinorrhea.   Eyes: Negative for visual  disturbance.  Respiratory: Negative for cough and shortness of breath.   Cardiovascular: Positive for chest pain. Negative for palpitations.  Gastrointestinal: Negative for nausea, vomiting and abdominal pain.  Genitourinary: Negative for dysuria and difficulty urinating.  Musculoskeletal: Negative for back pain and neck pain.  Skin: Negative for pallor and rash.  Neurological: Negative for light-headedness and headaches.  Psychiatric/Behavioral: Negative for confusion.      Allergies  Review of patient's allergies indicates no known allergies.  Home Medications   Prior to Admission medications   Medication Sig Start Date End Date Taking? Authorizing Provider  aspirin 81 MG chewable tablet Chew 81 mg by mouth daily.    Historical Provider, MD  azithromycin (ZITHROMAX) 250 MG tablet Take 1 tablet (250 mg total) by mouth daily. Take first 2 tablets together, then 1 every day until finished. 09/09/15   [name], PA-C  nitroGLYCERIN (NITROSTAT) 0.4 MG SL tablet Place 0.4 mg under the tongue every 5 (five) minutes as needed for chest pain.    Historical Provider, MD  pravastatin (PRAVACHOL) 10 MG tablet Take 10 mg by mouth at bedtime.    Historical Provider, MD   BP 145/89 mmHg  Pulse 63  Temp(Src) 97.6 F (36.4 C) (Oral)  Resp 18  Ht 5\' 7"  (1.702 m)  Wt 72.576 kg  BMI 25.05 kg/m2  SpO2 100% Physical Exam  Constitutional: He is oriented to person, place, and time. He appears well-developed and well-nourished.  HENT:  Head: Normocephalic and atraumatic.  Eyes: EOM are normal. Pupils are equal,  round, and reactive to light.  Neck: Normal range of motion. Neck supple.  Cardiovascular: Normal rate, regular rhythm and intact distal pulses.   Pulmonary/Chest: Effort normal. No respiratory distress. He has wheezes (scattered expiratory wheezes).  Abdominal: Soft. He exhibits no distension. There is no tenderness.  Musculoskeletal: Normal range of motion. He exhibits no edema or  tenderness.  Neurological: He is alert and oriented to person, place, and time.  Skin: Skin is warm and dry. No rash noted.  Psychiatric: He has a normal mood and affect.  Nursing note and vitals reviewed.   ED Course  Procedures (including critical care time) Labs Review Labs Reviewed  LIPID PANEL - Abnormal; Notable for the following:    LDL Cholesterol 108 (*)    All other components within normal limits  HEPATIC FUNCTION PANEL - Abnormal; Notable for the following:    Total Protein 6.4 (*)    ALT 13 (*)    All other components within normal limits  BASIC METABOLIC PANEL  CBC  TROPONIN I  TROPONIN I  HEMOGLOBIN A1C  HIV ANTIBODY (ROUTINE TESTING)  HEPATITIS C ANTIBODY (REFLEX)  TROPONIN I  I-STAT TROPOININ, ED    Imaging Review Dg Chest 2 View  05/09/2016  CLINICAL DATA:  A left chest pain starting at 3 a.m. EXAM: CHEST  2 VIEW COMPARISON:  09/09/2015 FINDINGS: Atherosclerotic aortic arch. A heart size within normal limits. A large lung volumes raise the possibility of emphysema. No acute findings. IMPRESSION: 1. Large lung volumes raise the possibility of emphysema. Atherosclerotic aortic arch. No acute findings. Electronically Signed   By: Van Clines M.D.   On: 05/09/2016 17:55   I have personally reviewed and evaluated these images and lab results as part of my medical decision-making.   EKG Interpretation   Date/Time:  Sunday May 09 2016 16:46:47 EDT Ventricular Rate:  25 PR Interval:  138 QRS Duration: 90 QT Interval:  408 QTC Calculation: 431 R Axis:   -74 Text Interpretation:  Normal sinus rhythm Left anterior fascicular block  Nonspecific ST abnormality Abnormal ECG since last tracing no significant  change Confirmed by MILLER  MD, BRIAN ([id number]) on 05/09/2016 4:58:41 PM      MDM   Final diagnoses:  Chest pain, unspecified chest pain type  Coronary artery disease with unspecified angina pectoris  S/P coronary artery stent placement   Patient  is a 70 year old male with history of coronary artery disease status post 2 stents in 2002, hyperlipidemia and significant smoking to presents with chest pain. Patient reports he woke from sleep with chest pain this morning. He took some Aleve at around 7 AM which relieved his pain for a time. However pain returned this afternoon while at rest patient's wife encouraged him to come to the ED to be evaluated. He did not take any aspirin or nitroglycerin prior to arrival. He reports he has a bottle of nitroglycerin at home but as it gave him a headache when he took it about 10 years ago he has not taken it since.  Presentation patient is afebrile, mildly hypertensive and otherwise hemodynamically stable. Currently pain free. No JVD, heart murmur, rales, or lower extremity edema on exam. EKG does not show acute ischemic changes. Chest x-ray demonstrates atherosclerosis of the aortic arch and hyper expanded lungs consistent with possible COPD. Initial troponin is within normal limits. Other labs unremarkable. This patient is reporting both typical and atypical symptoms for ACS. He is overll high risk with a history of coronary  artery disease and medication nonadherence. Plan to admit for ACS rule out. Internal medicine was consulted for admission in triage of patient. Patient will be admitted to the internal medicine teaching service for an ACS rule out.  Patient was seen and discussed with Dr. Sabra Heck, ED attending.      Gibson Ramp, MD 05/10/16 [place], MD 05/10/16 [phone]

## 2016-05-09 NOTE — ED Notes (Signed)
Patient transported to X-ray 

## 2016-05-10 ENCOUNTER — Other Ambulatory Visit: Payer: Self-pay

## 2016-05-10 ENCOUNTER — Encounter (HOSPITAL_COMMUNITY): Payer: Self-pay | Admitting: General Practice

## 2016-05-10 DIAGNOSIS — R079 Chest pain, unspecified: Secondary | ICD-10-CM

## 2016-05-10 DIAGNOSIS — R0789 Other chest pain: Principal | ICD-10-CM

## 2016-05-10 DIAGNOSIS — Z955 Presence of coronary angioplasty implant and graft: Secondary | ICD-10-CM

## 2016-05-10 DIAGNOSIS — I2510 Atherosclerotic heart disease of native coronary artery without angina pectoris: Secondary | ICD-10-CM | POA: Diagnosis not present

## 2016-05-10 DIAGNOSIS — E785 Hyperlipidemia, unspecified: Secondary | ICD-10-CM | POA: Diagnosis not present

## 2016-05-10 DIAGNOSIS — Z720 Tobacco use: Secondary | ICD-10-CM

## 2016-05-10 DIAGNOSIS — F17210 Nicotine dependence, cigarettes, uncomplicated: Secondary | ICD-10-CM

## 2016-05-10 DIAGNOSIS — I25119 Atherosclerotic heart disease of native coronary artery with unspecified angina pectoris: Secondary | ICD-10-CM | POA: Diagnosis not present

## 2016-05-10 LAB — HIV ANTIBODY (ROUTINE TESTING W REFLEX): HIV Screen 4th Generation wRfx: NONREACTIVE

## 2016-05-10 LAB — TROPONIN I
Troponin I: <0.03 ng/mL (ref <0.031)
Troponin I: <0.03 ng/mL (ref <0.031)
Troponin I: <0.03 ng/mL (ref <0.031)

## 2016-05-10 LAB — LIPID PANEL
CHOLESTEROL: 172 mg/dL (ref 0–200)
HDL: 48 mg/dL (ref >40)
LDL Cholesterol: 108 mg/dL — ABNORMAL HIGH (ref 0–99)
Total CHOL/HDL Ratio: 3.6 RATIO
Triglycerides: 78 mg/dL (ref <150)
VLDL: 16 mg/dL (ref 0–40)

## 2016-05-10 LAB — HEPATIC FUNCTION PANEL
ALBUMIN: 3.5 g/dL (ref 3.5–5.0)
ALK PHOS: 75 U/L (ref 38–126)
ALT: 13 U/L — AB (ref 17–63)
AST: 19 U/L (ref 15–41)
Bilirubin, Direct: 0.2 mg/dL (ref 0.1–0.5)
Indirect Bilirubin: 0.4 mg/dL (ref 0.3–0.9)
TOTAL PROTEIN: 6.4 g/dL — AB (ref 6.5–8.1)
Total Bilirubin: 0.6 mg/dL (ref 0.3–1.2)

## 2016-05-10 MED ORDER — NITROGLYCERIN 2 % TD OINT
1.0000 [in_us] | TOPICAL_OINTMENT | Freq: Three times a day (TID) | TRANSDERMAL | Status: DC
  Administered 2016-05-10 (×3): 1 [in_us] via TOPICAL
  Filled 2016-05-10: qty 30

## 2016-05-10 MED ORDER — ASPIRIN 325 MG PO TABS
325.0000 mg | ORAL_TABLET | Freq: Every day | ORAL | Status: DC
  Administered 2016-05-10 – 2016-05-11 (×2): 325 mg via ORAL
  Filled 2016-05-10 (×2): qty 1

## 2016-05-10 MED ORDER — ENOXAPARIN SODIUM 40 MG/0.4ML ~~LOC~~ SOLN
40.0000 mg | Freq: Every day | SUBCUTANEOUS | Status: DC
  Administered 2016-05-10 (×2): 40 mg via SUBCUTANEOUS
  Filled 2016-05-10 (×2): qty 0.4

## 2016-05-10 MED ORDER — MORPHINE SULFATE (PF) 2 MG/ML IV SOLN: 1.0000 mg | INTRAVENOUS | Status: DC | PRN

## 2016-05-10 MED ORDER — SODIUM CHLORIDE 0.9% FLUSH
3.0000 mL | Freq: Two times a day (BID) | INTRAVENOUS | Status: DC
  Administered 2016-05-10 – 2016-05-11 (×4): 3 mL via INTRAVENOUS

## 2016-05-10 MED ORDER — ASPIRIN 81 MG PO CHEW: 81.0000 mg | CHEWABLE_TABLET | Freq: Every day | ORAL | Status: DC

## 2016-05-10 NOTE — Progress Notes (Signed)
Subjective: Patient seen and examined this morning.  No acute events overnight since admission.  Still reporting left sided chest pain that radiates to his left arm.  He denies any tenderness to palpation or worse with movement.  Describes a squeezing, pressure-like feeling that comes and goes.  States his chest pain is a little better with Aleve.  Objective: Vital signs in last 24 hours: Filed Vitals:   05/09/16 2330 05/10/16 0019 05/10/16 0608 05/10/16 1000  BP: 130/77 140/83 135/71 121/69  Pulse: 46 53 57   Temp:  97.8 F (36.6 C) 98.2 F (36.8 C)   TempSrc:  Oral Oral   Resp: 18 18 19    Height:  5\' 7"  (1.702 m)    Weight:  147 lb 4.8 oz (66.815 kg)    SpO2: 96% 98% 95%    Weight change:   Intake/Output Summary (Last 24 hours) at 05/10/16 1128 Last data filed at 05/10/16 0818  Gross per 24 hour  Intake    480 ml  Output   1150 ml  Net   -670 ml   General: resting in bed, pleasant, NAD HEENT: EOMI, no scleral icterus Cardiac: RRR, no rubs, murmurs or gallops Pulm: normal effort, no wheezes but breath sounds are a little distant Abd: soft, nontender, nondistended, BS present Ext: warm and well perfused, no pedal edema Neuro: alert and oriented X3, cranial nerves II-XII grossly intact  Lab Results: Basic Metabolic Panel:  Recent Labs Lab 05/09/16 1721  NA 139  K 3.6  CL 104  CO2 27  GLUCOSE 87  BUN 7  CREATININE 0.98  CALCIUM 9.1   Liver Function Tests:  Recent Labs Lab 05/10/16 0032  AST 19  ALT 13*  ALKPHOS 75  BILITOT 0.6  PROT 6.4*  ALBUMIN 3.5   CBC:  Recent Labs Lab 05/09/16 1721  WBC 5.4  HGB 14.9  HCT 44.6  MCV 89.9  PLT 188   Cardiac Enzymes:  Recent Labs Lab 05/10/16 0032 05/10/16 0229 05/10/16 0817  TROPONINI <0.03 <0.03 <0.03   Fasting Lipid Panel:  Recent Labs Lab 05/10/16 0032  CHOL 172  HDL 48  LDLCALC 108*  TRIG 78  CHOLHDL 3.6    Micro Results: No results found for this or any previous visit (from the  past 240 hour(s)). Studies/Results: Dg Chest 2 View  05/09/2016  CLINICAL DATA:  A left chest pain starting at 3 a.m. EXAM: CHEST  2 VIEW COMPARISON:  09/09/2015 FINDINGS: Atherosclerotic aortic arch. A heart size within normal limits. A large lung volumes raise the possibility of emphysema. No acute findings. IMPRESSION: 1. Large lung volumes raise the possibility of emphysema. Atherosclerotic aortic arch. No acute findings. Electronically Signed   By: Van Clines M.D.   On: 05/09/2016 17:55   Medications: I have reviewed the patient's current medications. Scheduled Meds: . aspirin  325 mg Oral Daily  . atorvastatin  40 mg Oral q1800  . enoxaparin (LOVENOX) injection  40 mg Subcutaneous QHS  . lisinopril  5 mg Oral Daily  . nicotine  21 mg Transdermal Daily  . nitroGLYCERIN  1 inch Topical Q8H  . sodium chloride flush  3 mL Intravenous Q12H   Continuous Infusions:  PRN Meds:.morphine injection, nitroGLYCERIN Assessment/Plan: Principal Problem:   Chest pain Active Problems:   CAD (coronary artery disease)   Tobacco abuse   Hyperlipidemia   S/P coronary artery stent placement  Chest Pain: patient presenting with both typical and atypical features in a patient with known  CAD s/p stents in 2003.  His labs look okay, cardiac enzymes are negative, and EKG is without ischemic changes.  However, considering his ongoing chest pain with exertional onset, we will consult cardiology for their recommendations - appreciate cardiology assistance - stress test in the AM for risk stratification - continue Aspirin 325mg  daily - continue statin - continue lisinopril - holding BB given bradycardia - A1c pending - smoking cessation  - NTG paste Q8H - sublingual NTG prn - morphine 1mg  Q4H prn  CAD: s/p BMS to proximal RCA in 2003.  Non-adherent to medications -Lipid panel with TC 172, HDL 48, LDL 108 -ASA, statin as above  Tobacco use: Patient with at least a 120 pack year smoking  history (1.5-2 PPD since age 55). He is resistant to quitting. Will need continued counseling and assistance to cut back and hopefully quit altogether. -Continue smoking cessation counseling -Nicotine patch  Health maintenance: - checking HIV, HCV  Diet: Heart healthy  DVT ppx: Lovenox  Code: DNR (confirmed by patient)  Dispo: Disposition is deferred at this time, awaiting improvement of current medical problems.  Anticipated discharge in approximately 1-2 day(s).   The patient does not have a current PCP (No primary care provider on file.) and does need an [facility] hospital follow-up appointment after discharge.  The patient does not have transportation limitations that hinder transportation to clinic appointments.  .Services Needed at time of discharge: Y = Yes, Blank = No PT:   OT:   RN:   Equipment:   Other:       Jule Ser, DO 05/10/2016, 11:28 AM

## 2016-05-10 NOTE — Consult Note (Signed)
CARDIOLOGY CONSULT NOTE   Patient ID: Craig Russell MRN: [id number], DOB/AGE: 21-Sep-1946   Admit date: 05/09/2016 Date of Consult: 05/10/2016   Primary Physician: Dr. Quintella Reichert Primary Cardiologist: None  Pt. Profile  Craig Russell is a 70 year old Caucasian male with past medical history of CAD status post stent 2003, HLD, tobacco abuse and relative bradycardia who has not taken his meds for 6 months came in with chest pain.  Problem List  Past Medical History  Diagnosis Date  . Coronary artery disease     s/p 2 stents in 01/2002  . High cholesterol   . Tobacco abuse     History reviewed. No pertinent past surgical history.   Allergies  No Known Allergies  HPI   Craig Russell is a 70 year old Caucasian male with past medical history of CAD s/p stent 2003, HLD, tobacco abuse, and medication noncompliance. He has not seen a medical provider since early 2016. He had a history of CAD and underwent MultiLink Zeta stent 3.0 x 18 mm in proximal RCA on 05/13/2002 by Dr. Lia Foyer. (per pt, he received 2 stents, his stent card only listed one stent) He has not had any further ischemic workup since. He does not like to take medications. He continued to smoke 2 pack per day and eat greasy food. He has thought about quitting in the past however does not appears to have commitment. He has not taken any of his medications for the past 6 month. He also has relative bradycardia and is not any beta blocker.  For the past year, he has noticed easy fatigability, however no chest pain or anginal symptoms. He was in his usual state of health until he woke up in the morning of 05/09/2016 with substernal sharp chest pain radiating down in the epigastric and left upper quadrant area of the stomach. He does have some degree of epigastric tenderness. He described it as a "toothless alligator biting into the area both at the front end at the back". He also has some left arm numbness. He says this is somewhat  different from his previous angina which he described as "an elephant sitting on the chest". He denies any recent fever, chill, and has not experienced any lower extremity edema, orthopnea or paroxysmal nocturnal dyspnea. He says walking around makes the chest pain worse. Initially he did not seek medical attention, however since the pain did not go away, he decided to seek medical attention around 5 PM in the afternoon of 5/21. Since onset of chest discomfort, the symptom has not completely go away since. EKG showed no significant ST-T wave changes. Serial troponin was negative. Cardiology has been consulted for chest pain.   Of note, he already ate breakfast and had coffee this morning.   Inpatient Medications  . aspirin  325 mg Oral Daily  . atorvastatin  40 mg Oral q1800  . enoxaparin (LOVENOX) injection  40 mg Subcutaneous QHS  . lisinopril  5 mg Oral Daily  . nicotine  21 mg Transdermal Daily  . nitroGLYCERIN  1 inch Topical Q8H  . sodium chloride flush  3 mL Intravenous Q12H    Family History Family History  Problem Relation Age of Onset  . Cancer Sister     "brain tumors"  . Heart failure Brother   . Pancreatic cancer Mother   . Cirrhosis Father     alcoholic     Social History Social History   Social History  . Marital Status: Married  Spouse Name: N/A  . Number of Children: N/A  . Years of Education: N/A   Occupational History  . Not on file.   Social History Main Topics  . Smoking status: Current Every Day Smoker -- 0.50 packs/day for 60 years    Types: Cigarettes  . Smokeless tobacco: Not on file  . Alcohol Use: No  . Drug Use: No  . Sexual Activity: Yes   Other Topics Concern  . 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  Cardiovascular:  No dyspnea on exertion, edema, orthopnea,  palpitations, paroxysmal nocturnal dyspnea. +chest pain Dermatological: No rash, lesions/masses Respiratory: No cough, dyspnea Urologic: No hematuria, dysuria Abdominal:   No nausea, vomiting, diarrhea, bright red blood per rectum, melena, or hematemesis Neurologic:  No visual changes, changes in mental status. +wkns All other systems reviewed and are otherwise negative except as noted above.  Physical Exam  Blood pressure 121/69, pulse 57, temperature 98.2 F (36.8 C), temperature source Oral, resp. rate 19, height 5\' 7"  (1.702 m), weight 147 lb 4.8 oz (66.815 kg), SpO2 95 %.  General: Pleasant, NAD Psych: Normal affect. Neuro: Alert and oriented X 3. Moves all extremities spontaneously. HEENT: Normal  Neck: Supple without bruits or JVD. Lungs:  Resp regular and unlabored, CTA. Heart: RRR no s3, s4, or murmurs. Abdomen: Soft, non-tender, non-distended, BS + x 4.  Extremities: No clubbing, cyanosis or edema. DP/PT/Radials 2+ and equal bilaterally.  Labs   Recent Labs  05/09/16 2355 05/10/16 0032 05/10/16 0229 05/10/16 0817  TROPONINI <0.03 <0.03 <0.03 <0.03   Lab Results  Component Value Date   WBC 5.4 05/09/2016   HGB 14.9 05/09/2016   HCT 44.6 05/09/2016   MCV 89.9 05/09/2016   PLT 188 05/09/2016     Recent Labs Lab 05/09/16 1721 05/10/16 0032  NA 139  --   K 3.6  --   CL 104  --   CO2 27  --   BUN 7  --   CREATININE 0.98  --   CALCIUM 9.1  --   PROT  --  6.4*  BILITOT  --  0.6  ALKPHOS  --  75  ALT  --  13*  AST  --  19  GLUCOSE 87  --    Lab Results  Component Value Date   CHOL 172 05/10/2016   HDL 48 05/10/2016   LDLCALC 108* 05/10/2016   TRIG 78 05/10/2016   No results found for: DDIMER  Radiology/Studies  Dg Chest 2 View  05/09/2016  CLINICAL DATA:  A left chest pain starting at 3 a.m. EXAM: CHEST  2 VIEW COMPARISON:  09/09/2015 FINDINGS: Atherosclerotic aortic arch. A heart size within normal limits. A large lung volumes raise the  possibility of emphysema. No acute findings. IMPRESSION: 1. Large lung volumes raise the possibility of emphysema. Atherosclerotic aortic arch. No acute findings. Electronically Signed   By: Van Clines M.D.   On: 05/09/2016 17:55    ECG  Normal sinus rhythm without significant ST-T wave changes.  ASSESSMENT AND PLAN  1. Prolonged chest pain: some typical and atypical features, atypical feature include prolonged CP without EKG or trop change. Typical feature include worsening discomfort with exertion  - will discuss with MD regarding ischemic workup  2. CAD s/p  stent to prox RCA 02/13/2002  3. HLD: noncompliant with medication for at least 6 month  - lipid panel 5/22 chol 172, trig 78, HDL 48, LDL 108. Started on lipitor  4. Tobacco abuse: smoke 2 ppd, counseled on cessation  5. 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 Pain has never completely resolved. H/O CAD s/p BMS prox RCA by Dr Philmore Pali in 2003. Exam benign. Labs OK. Enz neg. EKG w/o acute changes. Plan exercise MV stress test tomorrow AM to risk stratify. Pt and family agreeable.  Lorretta Harp, M.D., [place], [facility], [place] [address] [place], [place]  [address]  [phone] 05/10/2016 11:26 AM

## 2016-05-11 ENCOUNTER — Observation Stay (HOSPITAL_COMMUNITY): Payer: Medicare Other

## 2016-05-11 DIAGNOSIS — R079 Chest pain, unspecified: Secondary | ICD-10-CM | POA: Diagnosis not present

## 2016-05-11 DIAGNOSIS — R0789 Other chest pain: Secondary | ICD-10-CM | POA: Diagnosis not present

## 2016-05-11 LAB — NM MYOCAR MULTI W/SPECT W/WALL MOTION / EF
CHL RATE OF PERCEIVED EXERTION: 0
CSEPHR: 70 %
Estimated workload: 1 METS
Exercise duration (min): 0 min
Exercise duration (sec): 0 s
MPHR: 149 {beats}/min
Peak HR: 105 {beats}/min
Rest HR: 60 {beats}/min

## 2016-05-11 LAB — HEPATITIS C ANTIBODY (REFLEX): HCV Ab: <0.1 s/co ratio (ref 0.0–0.9)

## 2016-05-11 LAB — HEMOGLOBIN A1C
HEMOGLOBIN A1C: 5.9 % — AB (ref 4.8–5.6)
MEAN PLASMA GLUCOSE: 123 mg/dL

## 2016-05-11 LAB — HCV COMMENT:

## 2016-05-11 MED ORDER — ATORVASTATIN CALCIUM 40 MG PO TABS: 40.0000 mg | ORAL_TABLET | Freq: Every day | ORAL | Status: DC

## 2016-05-11 MED ORDER — TECHNETIUM TC 99M TETROFOSMIN IV KIT
10.0000 | PACK | Freq: Once | INTRAVENOUS | Status: AC | PRN
  Administered 2016-05-11: 10 via INTRAVENOUS

## 2016-05-11 MED ORDER — REGADENOSON 0.4 MG/5ML IV SOLN
0.4000 mg | Freq: Once | INTRAVENOUS | Status: AC
  Administered 2016-05-11: 0.4 mg via INTRAVENOUS
  Filled 2016-05-11: qty 5

## 2016-05-11 MED ORDER — TECHNETIUM TC 99M TETROFOSMIN IV KIT
30.0000 | PACK | Freq: Once | INTRAVENOUS | Status: AC | PRN
  Administered 2016-05-11: 30 via INTRAVENOUS

## 2016-05-11 MED ORDER — NICOTINE 21 MG/24HR TD PT24: 21.0000 mg | MEDICATED_PATCH | Freq: Every day | TRANSDERMAL | Status: DC

## 2016-05-11 MED ORDER — REGADENOSON 0.4 MG/5ML IV SOLN
INTRAVENOUS | Status: AC
  Administered 2016-05-11: 0.4 mg via INTRAVENOUS
  Filled 2016-05-11: qty 5

## 2016-05-11 MED ORDER — PANTOPRAZOLE SODIUM 40 MG PO TBEC: 40.0000 mg | DELAYED_RELEASE_TABLET | Freq: Every day | ORAL | Status: AC

## 2016-05-11 MED ORDER — LISINOPRIL 5 MG PO TABS
5.0000 mg | ORAL_TABLET | Freq: Every day | ORAL | Status: DC
Start: 2016-05-11 — End: 2017-04-02

## 2016-05-11 NOTE — Progress Notes (Addendum)
Stress test per Dr. Gwenlyn Found no ischemia.-low risk  Pain is prob. Non cardiac

## 2016-05-11 NOTE — Progress Notes (Signed)
  Date: 05/11/2016  Patient name: Craig Russell  Medical record number: [id number]  Date of birth: [birth year]   This patient has been seen and the plan of care was discussed with the house staff. Please see Dr. Alcario Drought note for complete details. I concur with his findings.  Sid Falcon, MD 05/11/2016, 3:27 PM

## 2016-05-11 NOTE — Discharge Summary (Signed)
Name: Craig Russell MRN: [id number] DOB: 05-05-1946 70 y.o. PCP: No primary care provider on file.  Date of Admission: 05/09/2016  4:52 PM Date of Discharge: 05/11/2016 Attending Physician: No att. 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 Reported on 05/09/2016     atorvastatin 40 MG tablet  Commonly known as:  LIPITOR  Take 1 tablet (40 mg total) by mouth daily at 6 PM.     busPIRone 15 MG tablet  Commonly known as:  BUSPAR  Take 15 mg by mouth 2 (two) times daily. Reported on 05/09/2016     lisinopril 5 MG tablet  Commonly known as:  PRINIVIL,ZESTRIL  Take 1 tablet (5 mg total) by mouth daily.     nicotine 21 mg/24hr patch  Commonly known as:  NICODERM CQ - dosed in mg/24 hours  Place 1 patch (21 mg total) onto the skin daily.     nitroGLYCERIN 0.4 MG SL tablet  Commonly known as:  NITROSTAT  Place 0.4 mg under the tongue every 5 (five) minutes as needed for chest pain. Reported on 05/09/2016     pantoprazole 40 MG tablet  Commonly known as:  PROTONIX  Take 1 tablet (40 mg total) by mouth daily.        Disposition and follow-up:   CraigCraig Russell was discharged from [facility] in Stable condition.    1.  At the hospital follow up visit please address:  - has patient had any further chest pain? - is he taking his BP meds? - is he taking his aspirin and statin? - has he cut back on smoking?  2.  Labs / imaging needed at time of  follow-up: BMET  3.  Pending labs/ test needing follow-up: none  Follow-up Appointments:     Follow-up Information    Schedule an appointment as soon as possible for a visit with [facility].   Why:  they will call you for an appointment.  if you do not hear from them in a couple days, please call   Contact information:   [address] [place] [id number]      Discharge Instructions:   Consultations: Treatment Team:  [facility], MD  Procedures Performed:  Dg Chest 2 View  05/09/2016  CLINICAL DATA:  A left chest pain starting at 3 a.m. EXAM: CHEST  2 VIEW COMPARISON:  09/09/2015 FINDINGS: Atherosclerotic aortic arch. A heart size within normal limits. A large lung volumes raise the possibility of emphysema. No acute findings. IMPRESSION: 1. Large lung volumes raise the possibility of emphysema. Atherosclerotic aortic arch. No acute findings. Electronically Signed   By: Van Clines M.D.   On: 05/09/2016 17:55   Nm Myocar Multi W/spect W/wall Motion / Ef  05/11/2016  CLINICAL DATA:  70 year old male with chest pain and tightness. History  of coronary disease with angioplasty and cardiac stent placement. EXAM: MYOCARDIAL IMAGING WITH SPECT (REST AND PHARMACOLOGIC-STRESS) GATED LEFT VENTRICULAR WALL MOTION STUDY LEFT VENTRICULAR EJECTION FRACTION TECHNIQUE: Standard myocardial SPECT imaging was performed after resting intravenous injection of 10 mCi Tc-28m tetrofosmin. Subsequently, intravenous infusion of Lexiscan was performed under the supervision of the Cardiology staff. At peak effect of the drug, 30 mCi Tc-52m tetrofosmin was injected intravenously and standard myocardial SPECT imaging was performed. Quantitative gated imaging was also performed to evaluate left ventricular wall motion, and estimate left ventricular ejection fraction. COMPARISON:  Nuclear medicine myocardial perfusion scan 07/30/2010 FINDINGS: Stress  EKG:  No ST changes. Perfusion: No decreased activity in the left ventricle on stress imaging to suggest reversible ischemia or infarction. Wall Motion: LEFT ventricle is dilated on rest and stress. Global hypokinesia. Left Ventricular Ejection Fraction: 45 % End diastolic volume 123456 ml End systolic volume 66 ml IMPRESSION: 1. No reversible ischemia or infarction. 2. LEFT ventricular dilatation with global hypokinesia. 3. Left ventricular ejection fraction 45% 4. Non invasive risk stratification*: Intermediate risk (EF 35-49%) *2012 Appropriate Use Criteria for Coronary Revascularization Focused Update: J Am Coll Cardiol. [id number]. [email or web address]://[email or web address] Electronically Signed   By: Suzy Bouchard M.D.   On: 05/11/2016 14:35    Admission HPI:  Craig Russell is a 70 year old gentleman with PMH of CAD s/p cath 2 stents in 2002, HLD, and significant smoking history ( 1.5-2 PPD since age 17) who presents with chest pain. Patient states he was in his normal state of health until 3 AM yesterday morning when he was woken from sleep with a sharp constant right sided chest pain which radiated diagonally down to his left upper abdomen and up to his left chest. He reports tingling down his left arm and a burning sensation at his left neck. He had no associated diaphoresis, focal weakness, nausea, vomiting, SOB, headache, change in vision, loss of consciousness or radiation of his pain to the back. He feels his pain was worse with exertion. He took aleve for his pain which resolved after about 40 minutes. Patient felt well this morning, able to attend church and returned home. He says this pain returned while he was sitting on the couch and decided to come to the ED to be evaluated. He notes a decrease in energy of the last 2-3 months, but no limitation to ADLs. He has no pain when seen in the ED.  He carries a bottle of nitroglycerin in his pocket, but does not take it  as it gave him a headache once about 10 years ago. He says he has not taken any medications in at least 6 months, including his home aspirin and statin. He had a similar episode for which he was seen in the ED in September 2016, but no other chest pain since his cardiac cath. He has a 120 pack year smoking history and is resistant to quitting. He has a chronic smoker's cough. 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 He reports a history of CHF in his brother. He has a PCP in [place].  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 Also had some radiating symptoms of his pain. His troponin were trended and negative, EKG without ischemic changes. Cardiology was consulted given his risk factors and both typical/atypical features of his pain.  They performed stress test that did not reveal any areas of ischemia.  Pain from their standpoint was felt to be non-cardiac. He was continued on medical therapy. - continue Aspirin 81mg daily - continue atorvastatin 40mg  daily - continue lisinopril 5mg  daily - holding BB given bradycardia - A1c 5.9, recommend outpatient follow-up - smoking cessation   CAD: s/p BMS to proximal RCA in 2003. Non-adherent to medications -Lipid panel with TC 172, HDL 48, LDL 108 -ASA, statin as above  Tobacco use: Patient with at least a 120 pack year smoking history (1.5-2 PPD since age 7). He is resistant to quitting. Will need continued counseling and assistance to cut back and hopefully quit altogether. -Continue smoking cessation counseling -Nicotine patch  Discharge  Vitals:   BP 135/79 mmHg  Pulse 57  Temp(Src) 98.3 F (36.8 C) (Oral)  Resp 16  Ht 5\' 7"  (1.702 m)  Wt 146 lb 3.2 oz (66.316 kg)  BMI 22.89 kg/m2  SpO2 97%  Discharge Labs:  No results found for this or any previous visit (from the past 24 hour(s)).  Signed: Jule Ser, DO 05/11/2016, 8:40 PM    Services Ordered on Discharge: none Equipment Ordered on Discharge: none

## 2016-05-11 NOTE — Progress Notes (Signed)
Subjective: Patient seen and examined this morning.  No acute events overnight.  His wife is present in the room.  He is awaiting his stress test this morning.  Reports chest pain has resolved.  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  No nausea or vomiting.  No shortness of breath.  Reports he has been up out of bed to use bathroom with no symptoms.  Objective: Vital signs in last 24 hours: Filed Vitals:   05/11/16 1012 05/11/16 1014 05/11/16 1016 05/11/16 1018  BP: 125/83 170/107 143/80 139/82  Pulse:      Temp:      TempSrc:      Resp:      Height:      Weight:      SpO2:       Weight change: -13 lb 12.8 oz (-6.26 kg)  Intake/Output Summary (Last 24 hours) at 05/11/16 1105 Last data filed at 05/11/16 0800  Gross per 24 hour  Intake    460 ml  Output    950 ml  Net   -490 ml   General: resting in bed, pleasant, NAD HEENT: EOMI Cardiac: RRR, no rubs, murmurs or gallops Pulm: normal effort, no wheezes but breath sounds are a little distant Abd: soft, nontender, nondistended, BS present Ext: warm and well perfused, no pedal edema, some mild tenderness of left hip area and lumbar spine Neuro: alert and oriented X3, cranial nerves II-XII grossly intact  Lab Results: Basic Metabolic Panel:  Recent Labs Lab 05/09/16 1721  NA 139  K 3.6  CL 104  CO2 27  GLUCOSE 87  BUN 7  CREATININE 0.98  CALCIUM 9.1   Liver Function Tests:  Recent Labs Lab 05/10/16 0032  AST 19  ALT 13*  ALKPHOS 75  BILITOT 0.6  PROT 6.4*  ALBUMIN 3.5   CBC:  Recent Labs Lab 05/09/16 1721  WBC 5.4  HGB 14.9  HCT 44.6  MCV 89.9  PLT 188   Cardiac Enzymes:  Recent Labs Lab 05/10/16 0032 05/10/16 0229 05/10/16 0817  TROPONINI <0.03 <0.03 <0.03   Fasting Lipid Panel:  Recent Labs Lab 05/10/16 0032  CHOL 172  HDL 48  LDLCALC 108*  TRIG 78  CHOLHDL 3.6    Micro Results: No results found for this or any previous visit (from the past 240  hour(s)). Studies/Results: Dg Chest 2 View  05/09/2016  CLINICAL DATA:  A left chest pain starting at 3 a.m. EXAM: CHEST  2 VIEW COMPARISON:  09/09/2015 FINDINGS: Atherosclerotic aortic arch. A heart size within normal limits. A large lung volumes raise the possibility of emphysema. No acute findings. IMPRESSION: 1. Large lung volumes raise the possibility of emphysema. Atherosclerotic aortic arch. No acute findings. Electronically Signed   By: Van Clines M.D.   On: 05/09/2016 17:55   Medications: I have reviewed the patient's current medications. Scheduled Meds: . aspirin  325 mg Oral Daily  . atorvastatin  40 mg Oral q1800  . enoxaparin (LOVENOX) injection  40 mg Subcutaneous QHS  . lisinopril  5 mg Oral Daily  . nicotine  21 mg Transdermal Daily  . nitroGLYCERIN  1 inch Topical Q8H  . sodium chloride flush  3 mL Intravenous Q12H   Continuous Infusions:  PRN Meds:.morphine injection, nitroGLYCERIN Assessment/Plan: Principal Problem:   Chest pain Active Problems:   CAD (coronary artery disease)   Tobacco abuse   Hyperlipidemia   S/P coronary artery stent placement  Chest Pain: patient yesterday had pain  described as an Nurse, adult without teeth squeezing my my chest" that was left-sided and up to the LUQ of abdomen.  Also had some radiating symptoms of his pain.  His troponin were trended and negative, EKG without ischemic changes.  Cardiology was consulted given his risk factors and both typical/atypical features of his pain. - appreciate cardiology assistance - stress test today, further plan dependent on those results - continue Aspirin 325mg  daily - continue atorvastatin 40mg  daily - continue lisinopril 5mg  daily - holding BB given bradycardia - A1c 5.9, recommend outpatient follow-up - smoking cessation  - NTG paste Q8H - sublingual NTG prn - morphine 1mg  Q4H prn  CAD: s/p BMS to proximal RCA in 2003.  Non-adherent to medications -Lipid panel with TC 172, HDL 48,  LDL 108 -ASA, statin as above  Tobacco use: Patient with at least a 120 pack year smoking history (1.5-2 PPD since age 68). He is resistant to quitting. Will need continued counseling and assistance to cut back and hopefully quit altogether. -Continue smoking cessation counseling -Nicotine patch  Health maintenance: - HIV, HCV negative  Diet: Heart healthy  DVT ppx: Lovenox  Code: DNR  Dispo: Disposition is deferred at this time, awaiting improvement of current medical problems.  Anticipated discharge in approximately 1-2 day(s).   The patient does not have a current PCP (No primary care provider on file.) and does need an [facility] hospital follow-up appointment after discharge.  The patient does not have transportation limitations that hinder transportation to clinic appointments.  .Services Needed at time of discharge: Y = Yes, Blank = No PT:   OT:   RN:   Equipment:   Other:       Jule Ser, DO 05/11/2016, 11:05 AM

## 2016-05-11 NOTE — Progress Notes (Signed)
Attempted exercise myoview but HR did not climb, only to 103 at 8 min and 85% pred max was 128.  Pt was too exhausted to go further so test stopped,  BP at 123456 systolic.  HR down to 60 after 1 min.   Waited until improved BP and then plan for lexiscan myoview.    lexiscan myoview completed without complications.  nuc study results to follow.  BP was 170/102 with lexi.

## 2016-05-11 NOTE — Progress Notes (Signed)
Discharge instructions given. Pt verbalized understanding and all questions were answered.  

## 2016-05-11 NOTE — Discharge Instructions (Signed)
Craig Russell,  Your cardiac stress test was low-risk and there was no evidence of ischemia found.  It is unclear as to why you were having this pain but it is possible that your symptoms were due to acid reflux (or GERD).  We will send you home with a prescription of Protonix to take daily for a few weeks to see if this helps.  Otherwise, please continue to take your cholesterol medication, daily aspirin, and blood pressure medications.  Also, please try to quit smoking.  This is probably the single greatest thing you could do for your health.  Take care, Dr. Jule Ser

## 2016-05-18 DIAGNOSIS — K219 Gastro-esophageal reflux disease without esophagitis: Secondary | ICD-10-CM | POA: Diagnosis not present

## 2016-05-18 DIAGNOSIS — R0789 Other chest pain: Secondary | ICD-10-CM | POA: Diagnosis not present

## 2016-05-21 DIAGNOSIS — R0789 Other chest pain: Secondary | ICD-10-CM | POA: Diagnosis not present

## 2016-05-21 DIAGNOSIS — M549 Dorsalgia, unspecified: Secondary | ICD-10-CM | POA: Diagnosis not present

## 2016-05-21 DIAGNOSIS — K219 Gastro-esophageal reflux disease without esophagitis: Secondary | ICD-10-CM | POA: Diagnosis not present

## 2016-05-21 DIAGNOSIS — Z9189 Other specified personal risk factors, not elsewhere classified: Secondary | ICD-10-CM | POA: Diagnosis not present

## 2016-05-24 DIAGNOSIS — J449 Chronic obstructive pulmonary disease, unspecified: Secondary | ICD-10-CM | POA: Diagnosis not present

## 2016-05-24 DIAGNOSIS — Z79899 Other long term (current) drug therapy: Secondary | ICD-10-CM | POA: Diagnosis not present

## 2016-05-24 DIAGNOSIS — Z955 Presence of coronary angioplasty implant and graft: Secondary | ICD-10-CM | POA: Diagnosis not present

## 2016-05-24 DIAGNOSIS — F329 Major depressive disorder, single episode, unspecified: Secondary | ICD-10-CM | POA: Diagnosis not present

## 2016-05-24 DIAGNOSIS — K317 Polyp of stomach and duodenum: Secondary | ICD-10-CM | POA: Diagnosis not present

## 2016-05-24 DIAGNOSIS — F419 Anxiety disorder, unspecified: Secondary | ICD-10-CM | POA: Diagnosis not present

## 2016-05-24 DIAGNOSIS — I2510 Atherosclerotic heart disease of native coronary artery without angina pectoris: Secondary | ICD-10-CM | POA: Diagnosis not present

## 2016-05-24 DIAGNOSIS — I10 Essential (primary) hypertension: Secondary | ICD-10-CM | POA: Diagnosis not present

## 2016-05-24 DIAGNOSIS — D130 Benign neoplasm of esophagus: Secondary | ICD-10-CM | POA: Diagnosis not present

## 2016-05-24 DIAGNOSIS — K228 Other specified diseases of esophagus: Secondary | ICD-10-CM | POA: Diagnosis not present

## 2016-05-24 DIAGNOSIS — E7800 Pure hypercholesterolemia, unspecified: Secondary | ICD-10-CM | POA: Diagnosis not present

## 2016-05-24 DIAGNOSIS — K449 Diaphragmatic hernia without obstruction or gangrene: Secondary | ICD-10-CM | POA: Diagnosis not present

## 2016-05-24 DIAGNOSIS — K219 Gastro-esophageal reflux disease without esophagitis: Secondary | ICD-10-CM | POA: Diagnosis not present

## 2016-05-24 DIAGNOSIS — R0789 Other chest pain: Secondary | ICD-10-CM | POA: Diagnosis not present

## 2016-05-24 DIAGNOSIS — F17210 Nicotine dependence, cigarettes, uncomplicated: Secondary | ICD-10-CM | POA: Diagnosis not present

## 2016-05-27 DIAGNOSIS — M5127 Other intervertebral disc displacement, lumbosacral region: Secondary | ICD-10-CM | POA: Diagnosis not present

## 2016-05-27 DIAGNOSIS — M4806 Spinal stenosis, lumbar region: Secondary | ICD-10-CM | POA: Diagnosis not present

## 2016-06-08 DIAGNOSIS — R300 Dysuria: Secondary | ICD-10-CM | POA: Diagnosis not present

## 2016-06-08 DIAGNOSIS — N3090 Cystitis, unspecified without hematuria: Secondary | ICD-10-CM | POA: Diagnosis not present

## 2016-06-25 DIAGNOSIS — N390 Urinary tract infection, site not specified: Secondary | ICD-10-CM | POA: Diagnosis not present

## 2016-06-25 DIAGNOSIS — G4700 Insomnia, unspecified: Secondary | ICD-10-CM | POA: Diagnosis not present

## 2016-06-25 DIAGNOSIS — R5383 Other fatigue: Secondary | ICD-10-CM | POA: Diagnosis not present

## 2016-07-07 DIAGNOSIS — R7303 Prediabetes: Secondary | ICD-10-CM | POA: Diagnosis not present

## 2016-07-21 DIAGNOSIS — M19041 Primary osteoarthritis, right hand: Secondary | ICD-10-CM | POA: Diagnosis not present

## 2016-07-21 DIAGNOSIS — M79641 Pain in right hand: Secondary | ICD-10-CM | POA: Diagnosis not present

## 2016-07-28 DIAGNOSIS — M4806 Spinal stenosis, lumbar region: Secondary | ICD-10-CM | POA: Diagnosis not present

## 2016-07-28 DIAGNOSIS — Z79899 Other long term (current) drug therapy: Secondary | ICD-10-CM | POA: Diagnosis not present

## 2016-07-28 DIAGNOSIS — Z1212 Encounter for screening for malignant neoplasm of rectum: Secondary | ICD-10-CM | POA: Diagnosis not present

## 2016-07-28 DIAGNOSIS — Z125 Encounter for screening for malignant neoplasm of prostate: Secondary | ICD-10-CM | POA: Diagnosis not present

## 2016-07-28 DIAGNOSIS — Z0000 Encounter for general adult medical examination without abnormal findings: Secondary | ICD-10-CM | POA: Diagnosis not present

## 2016-07-28 DIAGNOSIS — F17200 Nicotine dependence, unspecified, uncomplicated: Secondary | ICD-10-CM | POA: Diagnosis not present

## 2016-07-28 DIAGNOSIS — Z1211 Encounter for screening for malignant neoplasm of colon: Secondary | ICD-10-CM | POA: Diagnosis not present

## 2016-07-28 DIAGNOSIS — E785 Hyperlipidemia, unspecified: Secondary | ICD-10-CM | POA: Diagnosis not present

## 2016-07-28 DIAGNOSIS — R5383 Other fatigue: Secondary | ICD-10-CM | POA: Diagnosis not present

## 2016-08-04 ENCOUNTER — Emergency Department (HOSPITAL_COMMUNITY)
Admission: EM | Admit: 2016-08-04 | Discharge: 2016-08-04 | Disposition: A | Discharge status: Home / Self Care | Payer: Medicare Other | Attending: Emergency Medicine | Admitting: Emergency Medicine

## 2016-08-04 ENCOUNTER — Encounter (HOSPITAL_COMMUNITY): Payer: Self-pay | Admitting: *Deleted

## 2016-08-04 DIAGNOSIS — Z720 Tobacco use: Secondary | ICD-10-CM | POA: Diagnosis not present

## 2016-08-04 DIAGNOSIS — Z7982 Long term (current) use of aspirin: Secondary | ICD-10-CM | POA: Insufficient documentation

## 2016-08-04 DIAGNOSIS — F17210 Nicotine dependence, cigarettes, uncomplicated: Secondary | ICD-10-CM | POA: Diagnosis not present

## 2016-08-04 DIAGNOSIS — R55 Syncope and collapse: Secondary | ICD-10-CM | POA: Diagnosis not present

## 2016-08-04 DIAGNOSIS — R531 Weakness: Secondary | ICD-10-CM | POA: Diagnosis not present

## 2016-08-04 DIAGNOSIS — R404 Transient alteration of awareness: Secondary | ICD-10-CM | POA: Diagnosis not present

## 2016-08-04 DIAGNOSIS — I509 Heart failure, unspecified: Secondary | ICD-10-CM | POA: Insufficient documentation

## 2016-08-04 DIAGNOSIS — Z955 Presence of coronary angioplasty implant and graft: Secondary | ICD-10-CM | POA: Insufficient documentation

## 2016-08-04 DIAGNOSIS — T671XXS Heat syncope, sequela: Secondary | ICD-10-CM

## 2016-08-04 DIAGNOSIS — I2510 Atherosclerotic heart disease of native coronary artery without angina pectoris: Secondary | ICD-10-CM | POA: Diagnosis not present

## 2016-08-04 HISTORY — DX: Unspecified systolic (congestive) heart failure: I50.20

## 2016-08-04 LAB — CBC
HCT: 39.3 % (ref 39.0–52.0)
HEMOGLOBIN: 13.4 g/dL (ref 13.0–17.0)
MCH: 30.6 pg (ref 26.0–34.0)
MCHC: 34.1 g/dL (ref 30.0–36.0)
MCV: 89.7 fL (ref 78.0–100.0)
PLATELETS: 162 10*3/uL (ref 150–400)
RBC: 4.38 MIL/uL (ref 4.22–5.81)
RDW: 13.9 % (ref 11.5–15.5)
WBC: 6 10*3/uL (ref 4.0–10.5)

## 2016-08-04 LAB — URINALYSIS, ROUTINE W REFLEX MICROSCOPIC
Glucose, UA: NEGATIVE mg/dL
Ketones, ur: NEGATIVE mg/dL
Leukocytes, UA: NEGATIVE
Nitrite: NEGATIVE
Protein, ur: NEGATIVE mg/dL
Specific Gravity, Urine: 1.023 (ref 1.005–1.030)
pH: 6 (ref 5.0–8.0)

## 2016-08-04 LAB — I-STAT TROPONIN, ED: TROPONIN I, POC: 0 ng/mL (ref 0.00–0.08)

## 2016-08-04 LAB — BASIC METABOLIC PANEL
Anion gap: 8 (ref 5–15)
BUN: 11 mg/dL (ref 6–20)
CO2: 24 mmol/L (ref 22–32)
Calcium: 9.1 mg/dL (ref 8.9–10.3)
Chloride: 105 mmol/L (ref 101–111)
Creatinine, Ser: 1.05 mg/dL (ref 0.61–1.24)
GFR calc Af Amer: >60 mL/min (ref >60)
GFR calc non Af Amer: >60 mL/min (ref >60)
Glucose, Bld: 119 mg/dL — ABNORMAL HIGH (ref 65–99)
Potassium: 3.4 mmol/L — ABNORMAL LOW (ref 3.5–5.1)
Sodium: 137 mmol/L (ref 135–145)

## 2016-08-04 LAB — URINE MICROSCOPIC-ADD ON: WBC, UA: NONE SEEN WBC/hpf (ref 0–5)

## 2016-08-04 MED ORDER — SODIUM CHLORIDE 0.9 % IV BOLUS (SEPSIS): 1000.0000 mL | Freq: Once | INTRAVENOUS | Status: DC

## 2016-08-04 MED ORDER — SODIUM CHLORIDE 0.9 % IV SOLN: INTRAVENOUS | Status: DC

## 2016-08-04 MED ORDER — SODIUM CHLORIDE 0.9 % IV BOLUS (SEPSIS)
1000.0000 mL | Freq: Once | INTRAVENOUS | Status: AC
  Administered 2016-08-04: 1000 mL via INTRAVENOUS

## 2016-08-04 NOTE — ED Triage Notes (Signed)
Pt to ED by EMS c/o near syncope with incontinence. Pt was sitting on the porch of his house, talking to his family, then felt nausea and hot all of a sudden. Attempted to walk into house and family member helped ease pt to the floor. Denies hitting head or falling. Pt took 324 mg ASA prior to EMS arrival. Denies chest pain. Pt had a cardiac workup in July with negative stress test per pt. Hx of two stents in 2011

## 2016-08-04 NOTE — ED Notes (Signed)
Patient undressed, in gown, on monitor, continuous pulse oximetry and blood pressure cuff 

## 2016-08-04 NOTE — ED Provider Notes (Signed)
[facility] DEPT Provider Note   CSN: [id number] Arrival date & time: 08/04/16  1929     History   Chief Complaint Chief Complaint  Patient presents with  . Near Syncope    HPI Craig Russell is a 70 y.o. male.  The history is provided by the patient.  Loss of Consciousness   This is a new problem. The current episode started 1 to 2 hours ago. The problem occurs rarely. The problem has been resolved. He lost consciousness for a period of 1 to 5 minutes. The problem is associated with normal activity. Associated symptoms include bladder incontinence, diaphoresis, light-headedness and nausea. Pertinent negatives include abdominal pain, back pain, chest pain, fever, focal weakness, headaches, palpitations, seizures, vertigo, visual change and vomiting. He has tried nothing for the symptoms. His past medical history is significant for CAD and HTN. His past medical history does not include CVA or DM.    Past Medical History:  Diagnosis Date  . Coronary artery disease    s/p 2 stents in 01/2002  . HFrEF (heart failure with reduced ejection fraction) ([place]) 04/2016  . High cholesterol   . Tobacco abuse     Patient Active Problem List   Diagnosis Date Noted  . 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 CAD (coronary artery disease) 05/09/2016  . Tobacco abuse 05/09/2016  . Hyperlipidemia 05/09/2016  . S/P coronary artery stent placement 05/09/2016  . Chest pain 05/09/2016    History reviewed. No pertinent surgical history.     Home Medications    Prior to Admission medications   Medication Sig Start Date End Date Taking? Authorizing Provider  albuterol (PROVENTIL HFA) 108 (90 Base) MCG/ACT inhaler Inhale 1-2 puffs into the lungs every 6 (six) hours as needed for wheezing or shortness of breath.   Yes Historical Provider, MD  aspirin 81 MG chewable tablet Chew 81 mg by mouth daily. Reported on 05/09/2016   Yes Historical Provider, MD  atorvastatin  (LIPITOR) 40 MG tablet Take 1 tablet (40 mg total) by mouth daily at 6 PM. 05/11/16  Yes Jule Ser, DO  busPIRone (BUSPAR) 15 MG tablet Take 15 mg by mouth every evening. Reported on 05/09/2016    Yes Historical Provider, MD  lisinopril (PRINIVIL,ZESTRIL) 5 MG tablet Take 1 tablet (5 mg total) by mouth daily. 05/11/16  Yes Jule Ser, DO  Multiple Minerals-Vitamins (CAL MAG ZINC +D3) TABS Take 1 tablet by mouth every evening.   Yes Historical Provider, MD  nicotine (NICODERM CQ - DOSED IN MG/24 HOURS) 21 mg/24hr patch Place 1 patch (21 mg total) onto the skin daily. 05/11/16  Yes Jule Ser, DO  nitroGLYCERIN (NITROSTAT) 0.4 MG SL tablet Place 0.4 mg under the tongue every 5 (five) minutes as needed for chest pain. Reported on 05/09/2016   Yes Historical Provider, MD  pantoprazole (PROTONIX) 40 MG tablet Take 1 tablet (40 mg total) by mouth daily. 05/11/16  Yes Jule Ser, DO  traZODone (DESYREL) 50 MG tablet Take 50 mg by mouth every evening.  06/25/16  Yes Historical Provider, MD    Family History Family History  Problem Relation Age of Onset  . Pancreatic cancer Mother   . Cirrhosis Father     alcoholic  . Cancer Sister     "brain tumors"  . Heart failure Brother     Social History Social History  Substance Use Topics  . Smoking status: Current Every Day Smoker    Packs/day: 0.50    Years:  60.00    Types: Cigarettes  . Smokeless tobacco: Never Used  . Alcohol use No     Allergies   Review of patient's allergies indicates no known allergies.   Review of Systems Review of Systems  Constitutional: Positive for diaphoresis. Negative for chills and fever.  Respiratory: Negative for cough and shortness of breath.   Cardiovascular: Positive for syncope. Negative for chest pain, palpitations and leg swelling.  Gastrointestinal: Positive for nausea. Negative for abdominal pain and vomiting.  Genitourinary: Positive for bladder incontinence.  Musculoskeletal: Negative  for back pain.  Neurological: Positive for light-headedness. Negative for vertigo, focal weakness, seizures, numbness and headaches.  All other systems reviewed and are negative.    Physical Exam Updated Vital Signs BP 101/67   Pulse 60   Temp 98.3 F (36.8 C) (Oral)   Resp 12   Ht 5\' 7"  (1.702 m)   Wt 155 lb (70.3 kg)   SpO2 97%   BMI 24.28 kg/m   Physical Exam  Constitutional: He is oriented to person, place, and time. He appears well-nourished. No distress.  HENT:  Head: Normocephalic and atraumatic.  Right Ear: External ear normal.  Left Ear: External ear normal.  Eyes: Pupils are equal, round, and reactive to light. Right eye exhibits no discharge. Left eye exhibits no discharge. No scleral icterus.  Neck: Normal range of motion. Neck supple.  Cardiovascular: Normal rate, intact distal pulses and normal pulses.  Exam reveals no gallop and no friction rub.   No murmur heard. No carotid bruits.  Pulmonary/Chest: Effort normal and breath sounds normal. No stridor. No respiratory distress. He has no wheezes. He has no rales. He exhibits no tenderness.  Abdominal: Soft. He exhibits no distension and no mass. There is no tenderness. There is no rebound and no guarding.  Musculoskeletal: He exhibits no edema or tenderness.  Neurological: He is alert and oriented to person, place, and time.  Mental Status: Alert and oriented to person, place, and time. Attention and concentration normal. Speech clear. Recent memory is intac  Cranial Nerves  II Visual Fields: Intact to confrontation. Visual fields intact. III, IV, VI: Pupils equal and reactive to light and near. Full eye movement without nystagmus  V Facial Sensation: Normal. No weakness of masticatory muscles  VII: No facial weakness or asymmetry  VIII Auditory Acuity: Grossly normal  IX/X: The uvula is midline; the palate elevates symmetrically  XI: Normal sternocleidomastoid and trapezius strength  XII: The tongue is  midline. No atrophy or fasciculations.   Motor System: Muscle Strength: 5/5 and symmetric in the upper and lower extremities. No pronation or drift.  Muscle Tone: Tone and muscle bulk are normal in the upper and lower extremities.   Reflexes: DTRs: 2+ and symmetrical in all four extremities. Plantar responses are flexor bilaterally.  Coordination: Intact finger-to-nose, heel-to-shin, and rapid alternating movements. No tremor.  Sensation: Intact to light touch, and pinprick. Negative Romberg test.  Gait: Routine and tandem gait are normal    Skin: Skin is warm and dry. No rash noted. He is not diaphoretic. No erythema.     ED Treatments / Results  Labs (all labs ordered are listed, but only abnormal results are displayed) Labs Reviewed  BASIC METABOLIC PANEL - Abnormal; Notable for the following:       Result Value   Potassium 3.4 (*)    Glucose, Bld 119 (*)    All other components within normal limits  URINALYSIS, ROUTINE W REFLEX MICROSCOPIC (NOT AT [facility]) -  Abnormal; Notable for the following:    Hgb urine dipstick TRACE (*)    Bilirubin Urine SMALL (*)    All other components within normal limits  URINE MICROSCOPIC-ADD ON - Abnormal; Notable for the following:    Squamous Epithelial / LPF 0-5 (*)    Bacteria, UA RARE (*)    Casts HYALINE CASTS (*)    All other components within normal limits  CBC  CBG MONITORING, ED  I-STAT TROPOININ, ED    EKG  EKG Interpretation  Date/Time:  Wednesday August 04 2016 19:31:15 EDT Ventricular Rate:  62 PR Interval:    QRS Duration: 106 QT Interval:  445 QTC Calculation: 452 R Axis:   -68 Text Interpretation:  Sinus rhythm Incomplete RBBB and LAFB Abnormal R-wave progression, early transition No significant change since last tracing Confirmed by [facility] MD, Mariesha Venturella [id number]) on 08/04/2016 8:29:24 PM       Radiology No results found.  Procedures Procedures (including critical care time)  Medications Ordered in ED Medications    sodium chloride 0.9 % bolus 1,000 mL (1,000 mLs Intravenous New Bag/Given 08/04/16 2212)     Initial Impression / Assessment and Plan / ED Course  I have reviewed the triage vital signs and the nursing notes.  Pertinent labs & imaging results that were available during my care of the patient were reviewed by me and considered in my medical decision making (see chart for details).  Clinical Course    Syncopal episode at home witnessed by family lasting a few seconds, with return to baseline over 2-3 minute time period. Patient reported diaphoresis, nausea,  lightheadedness prior to the episode. No chest pain and shortness of breath. Patient did report urinary and bowel incontinence during the episode but no seizure-like activity reported by the family.  EKG without acute changes. No Brugada or arrhythmias. Initial troponin negative. Labs reassuring. No hypertension. No prior history of CHF. Low risk per [facility] syncope rule. However given the patient's advanced age, we discussed the case with internal medicine who evaluated the patient in the emergency department. They feel that this is related to over exerting himself while working outside and the patient is safe for discharge.   Strict return precautions were given to the patient.  Final Clinical Impressions(s) / ED Diagnoses   Final diagnoses:  Syncope and collapse   Disposition: Discharge  Condition: Good  I have discussed the results, Dx and Tx plan with the patient who expressed understanding and agree(s) with the plan. Discharge instructions discussed at great length. The patient was given strict return precautions who verbalized understanding of the instructions. No further questions at time of discharge.    Current Discharge Medication List      Follow Up: [place] [phone] Call  For help establishing care with a care  provider      Fatima Blank, MD 08/05/16 [phone]

## 2016-08-04 NOTE — Consult Note (Signed)
Medical Consultation   Craig Russell  [id number]  DOB: [birth year]  DOA: 08/04/2016  PCP: Charletta Cousin,   Outpatient Specialists:None   Requesting physician: Dr. Addison Lank, ER  Reason for consultation: syncope  History of Present Illness: Craig Russell is an 70 y.o. male with h/o CAD remotely who had a syncopal episode today.  Patient was swinging and chatting with son outside.  Sitting still and got nauseated, sweaty.  Decided to go inside and didn't make it inside.  Knees got weak and was lowered to ground, did not fall or hit head, no injuries.  No true LOC, but not responsive for seconds to minutes.    Today, up at 0400 (as usual).  Used wood splitter outside, took maybe 90 min.  Mowed outside his house and son's house - outside all day long.  Laid down and took about 2 hour nap.  Did not eat lunch.  Drank Pepsi, coffee, gatorade, tea.  Ate dinner and then went back outside and worked outside some more.    No chest pain and felt fine throughout the day.  Now he is back to his usual but is tired because it is ready for bedtime.  Last stress test was July 2017, normal.  Hospitalized in 5/17 for chest pain.        Review of Systems:  ROS As per HPI otherwise 10 point review of systems reviewed and negative.     Past Medical History: Past Medical History:  Diagnosis Date  . Coronary artery disease    s/p 2 stents in 01/2002  . HFrEF (heart failure with reduced ejection fraction) ([place]) 04/2016  . High cholesterol   . Tobacco abuse     Past Surgical History: History reviewed. No pertinent surgical history.   Allergies:  No Known Allergies   Social History:  reports that he has been smoking Cigarettes.  He has a 30.00 pack-year smoking history. He has never used smokeless tobacco. He reports that he does not drink alcohol or use drugs.   Family History: Family History  Problem Relation Age of Onset  . Pancreatic cancer Mother     . Cirrhosis Father     alcoholic  . Cancer Sister     "brain tumors"  . Heart failure Brother      Physical Exam: Vitals:   08/04/16 2100 08/04/16 2130 08/04/16 2200 08/04/16 2300  BP: 107/66 107/70 121/68 101/58  Pulse: 60 (!) 58 60 (!) 59  Resp: 19 16 17 18   Temp:      TempSrc:      SpO2: 96% 94% 96% 97%  Weight:      Height:        Constitutional: Alert and awake, oriented x3, not in any acute distress. Eyes: PERLA, EOMI, irises appear normal, anicteric sclera,  ENMT: external ears and nose appear normal, normal hearing             Lips appears normal, oropharynx mucosa, tongue, posterior pharynx appear normal  Neck: neck appears normal, no masses, normal ROM, no thyromegaly, no JVD  CVS: S1-S2 clear, no murmur rubs or gallops, no LE edema, normal pedal pulses  Respiratory:  clear to auscultation bilaterally, no wheezing, rales or rhonchi. Respiratory effort normal. No accessory muscle use.  Abdomen: soft nontender, nondistended, normal bowel sounds, no hepatosplenomegaly, no hernias  Musculoskeletal: : no cyanosis, clubbing or edema noted bilaterally Neuro: Cranial nerves  II-XII intact, strength, sensation, reflexes Psych: judgement and insight appear normal, stable mood and affect, mental status Skin: no rashes or lesions or ulcers, no induration or nodules   Data reviewed:  I have personally reviewed following labs and imaging studies Labs:  CBC:  Recent Labs Lab 08/04/16 1945  WBC 6.0  HGB 13.4  HCT 39.3  MCV 89.7  PLT 0000000    Basic Metabolic Panel:  Recent Labs Lab 08/04/16 1945  NA 137  K 3.4*  CL 105  CO2 24  GLUCOSE 119*  BUN 11  CREATININE 1.05  CALCIUM 9.1   GFR Estimated Creatinine Clearance: 60.3 mL/min (by C-G formula based on SCr of 1.05 mg/dL). Liver Function Tests: No results for input(s): AST, ALT, ALKPHOS, BILITOT, PROT, ALBUMIN in the last 168 hours. No results for input(s): LIPASE, AMYLASE in the last 168 hours. No results for  input(s): AMMONIA in the last 168 hours. Coagulation profile No results for input(s): INR, PROTIME in the last 168 hours.  Cardiac Enzymes: No results for input(s): CKTOTAL, CKMB, CKMBINDEX, TROPONINI in the last 168 hours. BNP: Invalid input(s): POCBNP CBG: No results for input(s): GLUCAP in the last 168 hours. D-Dimer No results for input(s): DDIMER in the last 72 hours. Hgb A1c No results for input(s): HGBA1C in the last 72 hours. Lipid Profile No results for input(s): CHOL, HDL, LDLCALC, TRIG, CHOLHDL, LDLDIRECT in the last 72 hours. Thyroid function studies No results for input(s): TSH, T4TOTAL, T3FREE, THYROIDAB in the last 72 hours.  Invalid input(s): FREET3 Anemia work up No results for input(s): VITAMINB12, FOLATE, FERRITIN, TIBC, IRON, RETICCTPCT in the last 72 hours. Urinalysis    Component Value Date/Time   COLORURINE YELLOW 08/04/2016 2210   APPEARANCEUR CLEAR 08/04/2016 2210   LABSPEC 1.023 08/04/2016 2210   PHURINE 6.0 08/04/2016 2210   GLUCOSEU NEGATIVE 08/04/2016 2210   HGBUR TRACE (A) 08/04/2016 2210   BILIRUBINUR SMALL (A) 08/04/2016 2210   KETONESUR NEGATIVE 08/04/2016 2210   PROTEINUR NEGATIVE 08/04/2016 2210   NITRITE NEGATIVE 08/04/2016 2210   LEUKOCYTESUR NEGATIVE 08/04/2016 2210     Microbiology No results found for this or any previous visit (from the past 240 hour(s)).     Inpatient Medications:   Scheduled Meds: Continuous Infusions:    Radiological Exams on Admission: No results found.  Impression/Recommendations Principal Problem:   Syncope Active Problems:   CAD (coronary artery disease)   Tobacco abuse  Syncope -patient with syncope vs. Near syncope x 1 today -Had been outside in the heat all day -The vast majority of the beverages he consumed were caffeinated -He did not eat lunch despite significant manual labor throughout the day -Hemodynamically stable -Normal labs/studies -Suspect that patient had mild  overexertion in the heat today, which led to his syncope -This was likely confounded by his lack of PO intake and primarily caffeine intake -This was discussed with the patient and he was counseled accordingly -He does have a h/o CAD requiring 2 stents in the past; however,he also was admitted for chest pain in 5/17 and had a negative nuclear stress test during that hospitalization -His [place] Syncope Score indicates that he is low risk  -Based on historical explanation for syncope in conjunction with negative ER evaluation, admission is likely not warranted at this time -I discussed this at length with the patient and his family and they are in agreement with the plan for discharge  Tobacco abuse Tobacco Dependence: encourage cessation.  This was discussed with the patient  and should be reviewed on an ongoing basis.     Thank you for this consultation.     Time Spent: 50 minutes  Karmen Bongo M.D. [facility] 08/05/2016, 3:36 AM

## 2016-08-05 DIAGNOSIS — R55 Syncope and collapse: Secondary | ICD-10-CM | POA: Diagnosis present

## 2016-08-05 HISTORY — DX: Syncope and collapse: R55

## 2016-08-11 DIAGNOSIS — R55 Syncope and collapse: Secondary | ICD-10-CM | POA: Diagnosis not present

## 2016-08-11 DIAGNOSIS — R001 Bradycardia, unspecified: Secondary | ICD-10-CM | POA: Diagnosis not present

## 2016-08-17 DIAGNOSIS — K219 Gastro-esophageal reflux disease without esophagitis: Secondary | ICD-10-CM | POA: Diagnosis not present

## 2016-08-17 DIAGNOSIS — Z87898 Personal history of other specified conditions: Secondary | ICD-10-CM | POA: Diagnosis not present

## 2016-08-17 DIAGNOSIS — I10 Essential (primary) hypertension: Secondary | ICD-10-CM | POA: Diagnosis not present

## 2016-08-17 DIAGNOSIS — F411 Generalized anxiety disorder: Secondary | ICD-10-CM | POA: Diagnosis not present

## 2016-08-17 DIAGNOSIS — F17210 Nicotine dependence, cigarettes, uncomplicated: Secondary | ICD-10-CM | POA: Diagnosis not present

## 2016-08-17 DIAGNOSIS — E785 Hyperlipidemia, unspecified: Secondary | ICD-10-CM | POA: Diagnosis not present

## 2016-08-17 DIAGNOSIS — R319 Hematuria, unspecified: Secondary | ICD-10-CM | POA: Diagnosis not present

## 2016-08-20 DIAGNOSIS — M4806 Spinal stenosis, lumbar region: Secondary | ICD-10-CM | POA: Diagnosis not present

## 2016-08-20 DIAGNOSIS — M5416 Radiculopathy, lumbar region: Secondary | ICD-10-CM | POA: Diagnosis not present

## 2016-08-20 DIAGNOSIS — M5136 Other intervertebral disc degeneration, lumbar region: Secondary | ICD-10-CM | POA: Diagnosis not present

## 2016-08-20 DIAGNOSIS — M4726 Other spondylosis with radiculopathy, lumbar region: Secondary | ICD-10-CM | POA: Diagnosis not present

## 2016-08-21 ENCOUNTER — Other Ambulatory Visit: Payer: Self-pay | Admitting: Internal Medicine

## 2016-08-30 DIAGNOSIS — R55 Syncope and collapse: Secondary | ICD-10-CM | POA: Diagnosis not present

## 2016-08-31 DIAGNOSIS — I25119 Atherosclerotic heart disease of native coronary artery with unspecified angina pectoris: Secondary | ICD-10-CM

## 2016-08-31 DIAGNOSIS — I429 Cardiomyopathy, unspecified: Secondary | ICD-10-CM | POA: Insufficient documentation

## 2016-08-31 DIAGNOSIS — R55 Syncope and collapse: Secondary | ICD-10-CM | POA: Diagnosis not present

## 2016-08-31 DIAGNOSIS — I471 Supraventricular tachycardia, unspecified: Secondary | ICD-10-CM

## 2016-08-31 DIAGNOSIS — R079 Chest pain, unspecified: Secondary | ICD-10-CM | POA: Diagnosis not present

## 2016-08-31 DIAGNOSIS — I509 Heart failure, unspecified: Secondary | ICD-10-CM

## 2016-08-31 HISTORY — DX: Cardiomyopathy, unspecified: I42.9

## 2016-08-31 HISTORY — DX: Heart failure, unspecified: I50.9

## 2016-08-31 HISTORY — DX: Supraventricular tachycardia, unspecified: I47.10

## 2016-08-31 HISTORY — DX: Supraventricular tachycardia: I47.1

## 2016-08-31 HISTORY — DX: Atherosclerotic heart disease of native coronary artery with unspecified angina pectoris: I25.119

## 2016-09-03 DIAGNOSIS — I480 Paroxysmal atrial fibrillation: Secondary | ICD-10-CM | POA: Diagnosis not present

## 2016-09-03 DIAGNOSIS — F419 Anxiety disorder, unspecified: Secondary | ICD-10-CM | POA: Diagnosis not present

## 2016-09-03 DIAGNOSIS — I25119 Atherosclerotic heart disease of native coronary artery with unspecified angina pectoris: Secondary | ICD-10-CM | POA: Diagnosis not present

## 2016-09-03 DIAGNOSIS — Z7982 Long term (current) use of aspirin: Secondary | ICD-10-CM | POA: Diagnosis not present

## 2016-09-03 DIAGNOSIS — I444 Left anterior fascicular block: Secondary | ICD-10-CM | POA: Diagnosis not present

## 2016-09-03 DIAGNOSIS — R9431 Abnormal electrocardiogram [ECG] [EKG]: Secondary | ICD-10-CM | POA: Diagnosis not present

## 2016-09-03 DIAGNOSIS — Z79899 Other long term (current) drug therapy: Secondary | ICD-10-CM | POA: Diagnosis not present

## 2016-09-03 DIAGNOSIS — E785 Hyperlipidemia, unspecified: Secondary | ICD-10-CM | POA: Diagnosis not present

## 2016-09-03 DIAGNOSIS — Z955 Presence of coronary angioplasty implant and graft: Secondary | ICD-10-CM | POA: Diagnosis not present

## 2016-09-03 DIAGNOSIS — R001 Bradycardia, unspecified: Secondary | ICD-10-CM | POA: Diagnosis not present

## 2016-09-03 DIAGNOSIS — I10 Essential (primary) hypertension: Secondary | ICD-10-CM | POA: Diagnosis not present

## 2016-09-03 DIAGNOSIS — F17200 Nicotine dependence, unspecified, uncomplicated: Secondary | ICD-10-CM | POA: Diagnosis not present

## 2016-09-03 DIAGNOSIS — I471 Supraventricular tachycardia: Secondary | ICD-10-CM | POA: Diagnosis not present

## 2016-09-03 DIAGNOSIS — I25118 Atherosclerotic heart disease of native coronary artery with other forms of angina pectoris: Secondary | ICD-10-CM | POA: Diagnosis not present

## 2016-09-03 HISTORY — PX: CARDIAC CATHETERIZATION: SHX172

## 2016-09-04 DIAGNOSIS — Z955 Presence of coronary angioplasty implant and graft: Secondary | ICD-10-CM | POA: Diagnosis not present

## 2016-09-04 DIAGNOSIS — I25118 Atherosclerotic heart disease of native coronary artery with other forms of angina pectoris: Secondary | ICD-10-CM | POA: Diagnosis not present

## 2016-09-04 DIAGNOSIS — I471 Supraventricular tachycardia: Secondary | ICD-10-CM | POA: Diagnosis not present

## 2016-09-04 DIAGNOSIS — I119 Hypertensive heart disease without heart failure: Secondary | ICD-10-CM | POA: Diagnosis not present

## 2016-09-04 DIAGNOSIS — Z79899 Other long term (current) drug therapy: Secondary | ICD-10-CM | POA: Diagnosis not present

## 2016-09-04 DIAGNOSIS — R55 Syncope and collapse: Secondary | ICD-10-CM | POA: Diagnosis not present

## 2016-09-04 DIAGNOSIS — R001 Bradycardia, unspecified: Secondary | ICD-10-CM | POA: Diagnosis not present

## 2016-09-04 DIAGNOSIS — I25119 Atherosclerotic heart disease of native coronary artery with unspecified angina pectoris: Secondary | ICD-10-CM | POA: Diagnosis not present

## 2016-09-04 DIAGNOSIS — R9431 Abnormal electrocardiogram [ECG] [EKG]: Secondary | ICD-10-CM | POA: Diagnosis not present

## 2016-09-04 DIAGNOSIS — I444 Left anterior fascicular block: Secondary | ICD-10-CM | POA: Diagnosis not present

## 2016-09-04 DIAGNOSIS — E785 Hyperlipidemia, unspecified: Secondary | ICD-10-CM | POA: Diagnosis not present

## 2016-09-04 DIAGNOSIS — I480 Paroxysmal atrial fibrillation: Secondary | ICD-10-CM | POA: Diagnosis not present

## 2016-09-04 DIAGNOSIS — I10 Essential (primary) hypertension: Secondary | ICD-10-CM | POA: Diagnosis not present

## 2016-09-23 DIAGNOSIS — M48062 Spinal stenosis, lumbar region with neurogenic claudication: Secondary | ICD-10-CM | POA: Diagnosis not present

## 2016-09-28 DIAGNOSIS — R55 Syncope and collapse: Secondary | ICD-10-CM | POA: Diagnosis not present

## 2016-09-28 DIAGNOSIS — I25119 Atherosclerotic heart disease of native coronary artery with unspecified angina pectoris: Secondary | ICD-10-CM | POA: Diagnosis not present

## 2016-10-04 DIAGNOSIS — I10 Essential (primary) hypertension: Secondary | ICD-10-CM | POA: Diagnosis not present

## 2016-10-04 DIAGNOSIS — L0201 Cutaneous abscess of face: Secondary | ICD-10-CM | POA: Diagnosis not present

## 2016-10-04 DIAGNOSIS — R05 Cough: Secondary | ICD-10-CM | POA: Diagnosis not present

## 2016-10-04 DIAGNOSIS — Z79899 Other long term (current) drug therapy: Secondary | ICD-10-CM | POA: Diagnosis not present

## 2016-10-04 DIAGNOSIS — H9201 Otalgia, right ear: Secondary | ICD-10-CM | POA: Diagnosis not present

## 2016-10-04 DIAGNOSIS — J029 Acute pharyngitis, unspecified: Secondary | ICD-10-CM | POA: Diagnosis not present

## 2016-10-04 DIAGNOSIS — R51 Headache: Secondary | ICD-10-CM | POA: Diagnosis not present

## 2016-10-07 DIAGNOSIS — R55 Syncope and collapse: Secondary | ICD-10-CM | POA: Diagnosis not present

## 2016-10-11 ENCOUNTER — Other Ambulatory Visit: Payer: Self-pay | Admitting: Internal Medicine

## 2016-10-11 DIAGNOSIS — R55 Syncope and collapse: Secondary | ICD-10-CM | POA: Diagnosis not present

## 2016-10-11 DIAGNOSIS — I25119 Atherosclerotic heart disease of native coronary artery with unspecified angina pectoris: Secondary | ICD-10-CM | POA: Diagnosis not present

## 2016-11-16 DIAGNOSIS — M79641 Pain in right hand: Secondary | ICD-10-CM | POA: Diagnosis not present

## 2016-11-16 DIAGNOSIS — R51 Headache: Secondary | ICD-10-CM | POA: Diagnosis not present

## 2016-11-16 DIAGNOSIS — F17210 Nicotine dependence, cigarettes, uncomplicated: Secondary | ICD-10-CM | POA: Diagnosis not present

## 2016-11-24 DIAGNOSIS — F411 Generalized anxiety disorder: Secondary | ICD-10-CM | POA: Diagnosis not present

## 2016-11-24 DIAGNOSIS — J029 Acute pharyngitis, unspecified: Secondary | ICD-10-CM | POA: Diagnosis not present

## 2016-11-24 DIAGNOSIS — K219 Gastro-esophageal reflux disease without esophagitis: Secondary | ICD-10-CM | POA: Diagnosis not present

## 2016-11-24 DIAGNOSIS — R51 Headache: Secondary | ICD-10-CM | POA: Diagnosis not present

## 2016-11-24 DIAGNOSIS — E785 Hyperlipidemia, unspecified: Secondary | ICD-10-CM | POA: Diagnosis not present

## 2016-11-24 DIAGNOSIS — Z23 Encounter for immunization: Secondary | ICD-10-CM | POA: Diagnosis not present

## 2016-11-24 DIAGNOSIS — M79641 Pain in right hand: Secondary | ICD-10-CM | POA: Diagnosis not present

## 2016-11-24 DIAGNOSIS — I10 Essential (primary) hypertension: Secondary | ICD-10-CM | POA: Diagnosis not present

## 2016-11-25 DIAGNOSIS — I10 Essential (primary) hypertension: Secondary | ICD-10-CM | POA: Diagnosis not present

## 2016-12-21 DIAGNOSIS — S0990XS Unspecified injury of head, sequela: Secondary | ICD-10-CM | POA: Diagnosis not present

## 2016-12-21 DIAGNOSIS — G44011 Episodic cluster headache, intractable: Secondary | ICD-10-CM | POA: Insufficient documentation

## 2016-12-21 DIAGNOSIS — G4459 Other complicated headache syndrome: Secondary | ICD-10-CM | POA: Insufficient documentation

## 2016-12-21 DIAGNOSIS — G4700 Insomnia, unspecified: Secondary | ICD-10-CM

## 2016-12-21 DIAGNOSIS — S0990XA Unspecified injury of head, initial encounter: Secondary | ICD-10-CM

## 2016-12-21 HISTORY — DX: Other complicated headache syndrome: G44.59

## 2016-12-21 HISTORY — DX: Episodic cluster headache, intractable: G44.011

## 2016-12-21 HISTORY — DX: Unspecified injury of head, initial encounter: S09.90XA

## 2016-12-21 HISTORY — DX: Insomnia, unspecified: G47.00

## 2016-12-30 DIAGNOSIS — I25118 Atherosclerotic heart disease of native coronary artery with other forms of angina pectoris: Secondary | ICD-10-CM | POA: Diagnosis not present

## 2016-12-30 DIAGNOSIS — I2510 Atherosclerotic heart disease of native coronary artery without angina pectoris: Secondary | ICD-10-CM | POA: Diagnosis not present

## 2016-12-30 DIAGNOSIS — E785 Hyperlipidemia, unspecified: Secondary | ICD-10-CM | POA: Diagnosis not present

## 2017-01-03 DIAGNOSIS — M19042 Primary osteoarthritis, left hand: Secondary | ICD-10-CM | POA: Diagnosis not present

## 2017-01-18 DIAGNOSIS — G44011 Episodic cluster headache, intractable: Secondary | ICD-10-CM | POA: Diagnosis not present

## 2017-01-18 DIAGNOSIS — S0990XS Unspecified injury of head, sequela: Secondary | ICD-10-CM | POA: Diagnosis not present

## 2017-01-18 DIAGNOSIS — G4700 Insomnia, unspecified: Secondary | ICD-10-CM | POA: Diagnosis not present

## 2017-02-24 DIAGNOSIS — Z01810 Encounter for preprocedural cardiovascular examination: Secondary | ICD-10-CM | POA: Diagnosis not present

## 2017-02-24 DIAGNOSIS — I2510 Atherosclerotic heart disease of native coronary artery without angina pectoris: Secondary | ICD-10-CM | POA: Diagnosis not present

## 2017-02-24 DIAGNOSIS — E785 Hyperlipidemia, unspecified: Secondary | ICD-10-CM | POA: Diagnosis not present

## 2017-02-24 DIAGNOSIS — R55 Syncope and collapse: Secondary | ICD-10-CM | POA: Diagnosis not present

## 2017-02-25 DIAGNOSIS — I2510 Atherosclerotic heart disease of native coronary artery without angina pectoris: Secondary | ICD-10-CM | POA: Diagnosis not present

## 2017-03-02 DIAGNOSIS — E785 Hyperlipidemia, unspecified: Secondary | ICD-10-CM | POA: Diagnosis not present

## 2017-03-02 DIAGNOSIS — K219 Gastro-esophageal reflux disease without esophagitis: Secondary | ICD-10-CM | POA: Diagnosis not present

## 2017-03-02 DIAGNOSIS — R7303 Prediabetes: Secondary | ICD-10-CM | POA: Diagnosis not present

## 2017-03-02 DIAGNOSIS — M25512 Pain in left shoulder: Secondary | ICD-10-CM | POA: Diagnosis not present

## 2017-03-02 DIAGNOSIS — F17200 Nicotine dependence, unspecified, uncomplicated: Secondary | ICD-10-CM | POA: Diagnosis not present

## 2017-03-02 DIAGNOSIS — F411 Generalized anxiety disorder: Secondary | ICD-10-CM | POA: Diagnosis not present

## 2017-03-02 DIAGNOSIS — I10 Essential (primary) hypertension: Secondary | ICD-10-CM | POA: Diagnosis not present

## 2017-03-11 DIAGNOSIS — M7542 Impingement syndrome of left shoulder: Secondary | ICD-10-CM | POA: Diagnosis not present

## 2017-03-17 DIAGNOSIS — M48062 Spinal stenosis, lumbar region with neurogenic claudication: Secondary | ICD-10-CM | POA: Diagnosis not present

## 2017-03-29 DIAGNOSIS — M5416 Radiculopathy, lumbar region: Secondary | ICD-10-CM | POA: Diagnosis not present

## 2017-03-29 DIAGNOSIS — M48062 Spinal stenosis, lumbar region with neurogenic claudication: Secondary | ICD-10-CM | POA: Diagnosis not present

## 2017-04-01 ENCOUNTER — Inpatient Hospital Stay (HOSPITAL_COMMUNITY)
Admission: EM | Admit: 2017-04-01 | Discharge: 2017-04-04 | DRG: 194 | Disposition: A | Discharge status: Home / Self Care | Payer: Medicare Other | Attending: Family Medicine | Admitting: Family Medicine

## 2017-04-01 ENCOUNTER — Encounter (HOSPITAL_COMMUNITY): Payer: Self-pay | Admitting: *Deleted

## 2017-04-01 ENCOUNTER — Emergency Department (HOSPITAL_COMMUNITY): Payer: Medicare Other

## 2017-04-01 DIAGNOSIS — I2510 Atherosclerotic heart disease of native coronary artery without angina pectoris: Secondary | ICD-10-CM | POA: Diagnosis not present

## 2017-04-01 DIAGNOSIS — J189 Pneumonia, unspecified organism: Secondary | ICD-10-CM | POA: Diagnosis present

## 2017-04-01 DIAGNOSIS — R066 Hiccough: Secondary | ICD-10-CM | POA: Diagnosis not present

## 2017-04-01 DIAGNOSIS — I700 Atherosclerosis of aorta: Secondary | ICD-10-CM | POA: Diagnosis present

## 2017-04-01 DIAGNOSIS — Z7982 Long term (current) use of aspirin: Secondary | ICD-10-CM

## 2017-04-01 DIAGNOSIS — J181 Lobar pneumonia, unspecified organism: Secondary | ICD-10-CM | POA: Diagnosis not present

## 2017-04-01 DIAGNOSIS — I110 Hypertensive heart disease with heart failure: Secondary | ICD-10-CM | POA: Diagnosis present

## 2017-04-01 DIAGNOSIS — Z955 Presence of coronary angioplasty implant and graft: Secondary | ICD-10-CM

## 2017-04-01 DIAGNOSIS — I10 Essential (primary) hypertension: Secondary | ICD-10-CM | POA: Diagnosis present

## 2017-04-01 DIAGNOSIS — Z66 Do not resuscitate: Secondary | ICD-10-CM | POA: Diagnosis present

## 2017-04-01 DIAGNOSIS — E876 Hypokalemia: Secondary | ICD-10-CM | POA: Diagnosis present

## 2017-04-01 DIAGNOSIS — Z7902 Long term (current) use of antithrombotics/antiplatelets: Secondary | ICD-10-CM

## 2017-04-01 DIAGNOSIS — I5022 Chronic systolic (congestive) heart failure: Secondary | ICD-10-CM | POA: Diagnosis not present

## 2017-04-01 DIAGNOSIS — G8929 Other chronic pain: Secondary | ICD-10-CM | POA: Diagnosis present

## 2017-04-01 DIAGNOSIS — Z79899 Other long term (current) drug therapy: Secondary | ICD-10-CM

## 2017-04-01 DIAGNOSIS — F17210 Nicotine dependence, cigarettes, uncomplicated: Secondary | ICD-10-CM | POA: Diagnosis present

## 2017-04-01 DIAGNOSIS — I2583 Coronary atherosclerosis due to lipid rich plaque: Secondary | ICD-10-CM | POA: Diagnosis not present

## 2017-04-01 DIAGNOSIS — R05 Cough: Secondary | ICD-10-CM | POA: Diagnosis not present

## 2017-04-01 HISTORY — DX: Other chronic pain: G89.29

## 2017-04-01 HISTORY — DX: Atherosclerotic heart disease of native coronary artery with unspecified angina pectoris: I25.119

## 2017-04-01 HISTORY — DX: Dorsalgia, unspecified: M54.9

## 2017-04-01 LAB — COMPREHENSIVE METABOLIC PANEL
ALK PHOS: 74 U/L (ref 38–126)
ALT: 15 U/L — ABNORMAL LOW (ref 17–63)
ANION GAP: 10 (ref 5–15)
AST: 22 U/L (ref 15–41)
Albumin: 3.4 g/dL — ABNORMAL LOW (ref 3.5–5.0)
BILIRUBIN TOTAL: 1.2 mg/dL (ref 0.3–1.2)
BUN: 11 mg/dL (ref 6–20)
CALCIUM: 8.9 mg/dL (ref 8.9–10.3)
CO2: 23 mmol/L (ref 22–32)
CREATININE: 0.99 mg/dL (ref 0.61–1.24)
Chloride: 102 mmol/L (ref 101–111)
GFR calc non Af Amer: >60 mL/min (ref >60)
GLUCOSE: 101 mg/dL — AB (ref 65–99)
Potassium: 3.4 mmol/L — ABNORMAL LOW (ref 3.5–5.1)
Sodium: 135 mmol/L (ref 135–145)
TOTAL PROTEIN: 6.8 g/dL (ref 6.5–8.1)

## 2017-04-01 LAB — CBC WITH DIFFERENTIAL/PLATELET
BASOS PCT: 0 %
Basophils Absolute: 0 10*3/uL (ref 0.0–0.1)
EOS ABS: 0.2 10*3/uL (ref 0.0–0.7)
EOS PCT: 2 %
HCT: 36.5 % — ABNORMAL LOW (ref 39.0–52.0)
Hemoglobin: 12.6 g/dL — ABNORMAL LOW (ref 13.0–17.0)
Lymphocytes Relative: 17 %
Lymphs Abs: 1.2 10*3/uL (ref 0.7–4.0)
MCH: 30.7 pg (ref 26.0–34.0)
MCHC: 34.5 g/dL (ref 30.0–36.0)
MCV: 89 fL (ref 78.0–100.0)
MONOS PCT: 9 %
Monocytes Absolute: 0.7 10*3/uL (ref 0.1–1.0)
NEUTROS ABS: 5.2 10*3/uL (ref 1.7–7.7)
Neutrophils Relative %: 72 %
Platelets: 167 10*3/uL (ref 150–400)
RBC: 4.1 MIL/uL — ABNORMAL LOW (ref 4.22–5.81)
RDW: 13.6 % (ref 11.5–15.5)
WBC: 7.3 10*3/uL (ref 4.0–10.5)

## 2017-04-01 LAB — URINALYSIS, ROUTINE W REFLEX MICROSCOPIC
BACTERIA UA: NONE SEEN
BILIRUBIN URINE: NEGATIVE
GLUCOSE, UA: NEGATIVE mg/dL
KETONES UR: 5 mg/dL — AB
Leukocytes, UA: NEGATIVE
NITRITE: NEGATIVE
Protein, ur: 30 mg/dL — AB
SPECIFIC GRAVITY, URINE: 1.019 (ref 1.005–1.030)
Squamous Epithelial / LPF: NONE SEEN
pH: 6 (ref 5.0–8.0)

## 2017-04-01 LAB — I-STAT CG4 LACTIC ACID, ED: Lactic Acid, Venous: 0.83 mmol/L (ref 0.5–1.9)

## 2017-04-01 MED ORDER — ACETAMINOPHEN 500 MG PO TABS: 1000.0000 mg | ORAL_TABLET | Freq: Once | ORAL | Status: DC

## 2017-04-01 MED ORDER — SODIUM CHLORIDE 0.9 % IV BOLUS (SEPSIS)
1000.0000 mL | Freq: Once | INTRAVENOUS | Status: AC
  Administered 2017-04-02: 1000 mL via INTRAVENOUS

## 2017-04-01 MED ORDER — ONDANSETRON HCL 4 MG/2ML IJ SOLN
4.0000 mg | Freq: Once | INTRAMUSCULAR | Status: AC
  Administered 2017-04-02: 4 mg via INTRAVENOUS
  Filled 2017-04-01: qty 2

## 2017-04-01 MED ORDER — LEVOFLOXACIN IN D5W 750 MG/150ML IV SOLN
750.0000 mg | INTRAVENOUS | Status: DC
  Administered 2017-04-02 – 2017-04-04 (×3): 750 mg via INTRAVENOUS
  Filled 2017-04-01 (×3): qty 150

## 2017-04-01 NOTE — ED Triage Notes (Addendum)
Pt here for fever of 101.4 and increased serosanguinous drainage after having outpatient back surgery on Tuesday (Dr Ellene Route).  Denies increased numbness/tingling in LE.  Denies loss of bowel or bladder control.  Also states cough.

## 2017-04-01 NOTE — ED Notes (Addendum)
Phlebotomy at bedside at this time.

## 2017-04-01 NOTE — ED Provider Notes (Signed)
[facility] DEPT Provider Note   CSN: [id number] Arrival date & time: 04/01/17  1818     History   Chief Complaint Chief Complaint  Patient presents with  . Fever    post-op    HPI Craig Russell is a 71 y.o. male.  HPI   Pt is a 71 yo male with PMH of CAD and HF who presents to the ED with complaint of fever. Pt reports having outpatient spinal surgery performed by Dr. Ellene Route 4 days ago. He states the following day he began to have a fever of 101 at home with associated productive cough (white sputum). Endorses associated chills, N/V. He also states he has been having hiccups daily for the past week, but notes they worsened after surgery. Wife at bedside reports he has been eating and drinking very little s/p surgery and thinks he is dehydrated. Wife also states he has been having bloody drainage from surgical site, denies purulent drainage or surrounding swelling. Denies SOB, CP, abdominal pain, urinary sxs, saddle anesthesia, urinary/bowel incontinence, numbness, weakness. He reports having mild pain to his lower back which he states is chronic and unchanged since surgery. Denies any current use of antibiotics. Denies taking any tylenol or ibuprofen at home for his fever.   PCP- Dr. Charletta Cousin   Past Medical History:  Diagnosis Date  . Chronic back pain   . Coronary artery disease    s/p 2 stents in 01/2002  . HFrEF (heart failure with reduced ejection fraction) ([place]) 04/2016  . High cholesterol   . Tobacco abuse     Patient Active Problem List   Diagnosis Date Noted  . Syncope 08/05/2016  . 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 CAD (coronary artery disease) 05/09/2016  . Tobacco abuse 05/09/2016  . Hyperlipidemia 05/09/2016  . S/P coronary artery stent placement 05/09/2016  . Chest pain 05/09/2016    Past Surgical History:  Procedure Laterality Date  . BACK SURGERY         Home Medications    Prior to Admission medications     Medication Sig Start Date End Date Taking? Authorizing Provider  albuterol (PROVENTIL HFA) 108 (90 Base) MCG/ACT inhaler Inhale 1-2 puffs into the lungs every 6 (six) hours as needed for wheezing or shortness of breath.    Historical Provider, MD  aspirin 81 MG chewable tablet Chew 81 mg by mouth daily. Reported on 05/09/2016    Historical Provider, MD  atorvastatin (LIPITOR) 40 MG tablet Take 1 tablet (40 mg total) by mouth daily at 6 PM. 05/11/16   Jule Ser, DO  busPIRone (BUSPAR) 15 MG tablet Take 15 mg by mouth every evening. Reported on 05/09/2016     Historical Provider, MD  lisinopril (PRINIVIL,ZESTRIL) 5 MG tablet Take 1 tablet (5 mg total) by mouth daily. 05/11/16   Jule Ser, DO  Multiple Minerals-Vitamins (CAL MAG ZINC +D3) TABS Take 1 tablet by mouth every evening.    Historical Provider, MD  nicotine (NICODERM CQ - DOSED IN MG/24 HOURS) 21 mg/24hr patch Place 1 patch (21 mg total) onto the skin daily. 05/11/16   Jule Ser, DO  nitroGLYCERIN (NITROSTAT) 0.4 MG SL tablet Place 0.4 mg under the tongue every 5 (five) minutes as needed for chest pain. Reported on 05/09/2016    Historical Provider, MD  pantoprazole (PROTONIX) 40 MG tablet Take 1 tablet (40 mg total) by mouth daily. 05/11/16   Jule Ser, DO  traZODone (DESYREL) 50 MG tablet Take 50  mg by mouth every evening.  06/25/16   Historical Provider, MD    Family History Family History  Problem Relation Age of Onset  . Pancreatic cancer Mother   . Cirrhosis Father     alcoholic  . Cancer Sister     "brain tumors"  . Heart failure Brother     Social History Social History  Substance Use Topics  . Smoking status: Current Every Day Smoker    Packs/day: 0.50    Years: 60.00    Types: Cigarettes  . Smokeless tobacco: Never Used  . Alcohol use No     Allergies   Patient has no known allergies.   Review of Systems Review of Systems  Constitutional: Positive for chills and fever.  Respiratory: Positive  for cough.   Gastrointestinal: Positive for nausea and vomiting.       Hiccups  Skin: Positive for wound (drainage).  All other systems reviewed and are negative.    Physical Exam Updated Vital Signs BP (!) 114/102   Pulse 89   Temp 98.9 F (37.2 C) (Oral)   Resp (!) 22   Ht 5\' 7"  (1.702 m)   Wt 74.8 kg   SpO2 99%   BMI 25.84 kg/m   Physical Exam  Constitutional: He is oriented to person, place, and time. He appears well-developed and well-nourished.  Pt continuously hiccupping throughout exam.  HENT:  Head: Normocephalic and atraumatic.  Mouth/Throat: Oropharynx is clear and moist. No oropharyngeal exudate.  Eyes: Conjunctivae and EOM are normal. Right eye exhibits no discharge. Left eye exhibits no discharge. No scleral icterus.  Neck: Normal range of motion. Neck supple.  Cardiovascular: Normal rate, regular rhythm, normal heart sounds and intact distal pulses.   HR 87  Pulmonary/Chest: Effort normal and breath sounds normal. No respiratory distress. He has no wheezes. He has no rales. He exhibits no tenderness.  Abdominal: Soft. Bowel sounds are normal. He exhibits no distension and no mass. There is no tenderness. There is no rebound and no guarding. No hernia.  Musculoskeletal: He exhibits no edema.  No midline C, T, or L tenderness. Full range of motion of neck and back. Full range of motion of bilateral upper and lower extremities, with 5/5 strength. Sensation intact. 2+ radial and PT pulses. Cap refill <2 seconds.  Neurological: He is alert and oriented to person, place, and time.  Skin: Skin is warm and dry.  Healing surgical incision present to lumbar midline with moderate amount of serosanguinous drainage present to dressing. No surrounding swelling, erythema, warmth, induration, fluctuance or drainage able to be expelled.  Nursing note and vitals reviewed.    ED Treatments / Results  Labs (all labs ordered are listed, but only abnormal results are  displayed) Labs Reviewed  COMPREHENSIVE METABOLIC PANEL - Abnormal; Notable for the following:       Result Value   Potassium 3.4 (*)    Glucose, Bld 101 (*)    Albumin 3.4 (*)    ALT 15 (*)    All other components within normal limits  CBC WITH DIFFERENTIAL/PLATELET - Abnormal; Notable for the following:    RBC 4.10 (*)    Hemoglobin 12.6 (*)    HCT 36.5 (*)    All other components within normal limits  URINALYSIS, ROUTINE W REFLEX MICROSCOPIC - Abnormal; Notable for the following:    Hgb urine dipstick MODERATE (*)    Ketones, ur 5 (*)    Protein, ur 30 (*)    All other  components within normal limits  URINE CULTURE  CULTURE, BLOOD (ROUTINE X 2)  CULTURE, BLOOD (ROUTINE X 2)  I-STAT CG4 LACTIC ACID, ED    EKG  EKG Interpretation None       Radiology Dg Chest 2 View  Result Date: 04/01/2017 CLINICAL DATA:  Cough.  Fever.  Dyspnea. EXAM: CHEST  2 VIEW COMPARISON:  08/31/2016 chest radiograph. FINDINGS: Stable cardiomediastinal silhouette with normal heart size and aortic atherosclerosis. No pneumothorax. No pleural effusion. Hyperinflated lungs. No pulmonary edema. Mild hazy opacity in the basilar right lower lobe appears new. IMPRESSION: 1. Mild new hazy basilar right lower lobe opacity, which may represent a developing pneumonia. Recommend follow-up PA and lateral post treatment chest radiographs in 4-6 weeks. 2. Hyperinflated lungs, suggesting COPD. 3. Aortic atherosclerosis. Electronically Signed   By: Ilona Sorrel M.D.   On: 04/01/2017 19:29    Procedures Procedures (including critical care time)  Medications Ordered in ED Medications  levofloxacin (LEVAQUIN) IVPB 750 mg (750 mg Intravenous New Bag/Given 04/02/17 0017)  metoCLOPramide (REGLAN) injection 10 mg (10 mg Intravenous Refused 04/02/17 0017)  sodium chloride 0.9 % bolus 1,000 mL (1,000 mLs Intravenous New Bag/Given 04/02/17 0002)  ondansetron (ZOFRAN) injection 4 mg (4 mg Intravenous Given 04/02/17 0002)      Initial Impression / Assessment and Plan / ED Course  I have reviewed the triage vital signs and the nursing notes.  Pertinent labs & imaging results that were available during my care of the patient were reviewed by me and considered in my medical decision making (see chart for details).    Pt presents with fever, cough, nausea and vomiting that started 2 days ago. He reports having lumbar spinal surgery performed 3 days ago by Dr. Ellene Route without any complications. Reports having small amount of bloody drainage from surgical site, denies purulent drainage. Initial vitals performed in triage revealed temp 100.9, heart rate 110, O2 sat 92%, pt placed on 1L [place]. On my initial evaluation, HR 87, O2 sat 98% on 1L [place]. Pt with mild tachypnea on exam, RR 22. Exam otherwise unremarkable, lungs CTAB. Healing surgical incision present to lumbar midline with moderate amount of serosanguinous drainage present to dressing. No surrounding swelling, erythema, warmth, induration, fluctuance or drainage able to be expelled. No midline spinal tenderness. No neuro deficits.   Pt given IVF and zofran. No leukocytosis. Lactic acid 0.83. UA without signs of infection. CXR showed new basilar right lower lobe opacity. Discussed pt with Dr. Christy Gentles who also evaluated pt in the ED. Pt started on IV levoquin in the ED. Plan to admit pt for pneumonia.  Discussed results and plan for admission with pt. On reevaluation patient O2 sat 99% on RA. Consulted hospitalist. Dr. Loleta Books agrees to admission.  Final Clinical Impressions(s) / ED Diagnoses   Final diagnoses:  Community acquired pneumonia of right lower lobe of lung [facility])    New Prescriptions New Prescriptions   No medications on file     Nona Dell, [place] 04/02/17 0109    Ripley Fraise, MD 04/02/17 [phone]

## 2017-04-01 NOTE — ED Notes (Signed)
Pt's SpO2 level was 91-92% on RA.  Informed Hassan Rowan, RN. Pt placed on 2 L/min of O2.

## 2017-04-02 ENCOUNTER — Encounter (HOSPITAL_COMMUNITY): Payer: Self-pay | Admitting: Family Medicine

## 2017-04-02 DIAGNOSIS — G8929 Other chronic pain: Secondary | ICD-10-CM | POA: Diagnosis present

## 2017-04-02 DIAGNOSIS — I2510 Atherosclerotic heart disease of native coronary artery without angina pectoris: Secondary | ICD-10-CM

## 2017-04-02 DIAGNOSIS — I10 Essential (primary) hypertension: Secondary | ICD-10-CM | POA: Diagnosis not present

## 2017-04-02 DIAGNOSIS — J189 Pneumonia, unspecified organism: Secondary | ICD-10-CM | POA: Diagnosis present

## 2017-04-02 DIAGNOSIS — Z79899 Other long term (current) drug therapy: Secondary | ICD-10-CM | POA: Diagnosis not present

## 2017-04-02 DIAGNOSIS — E876 Hypokalemia: Secondary | ICD-10-CM | POA: Diagnosis present

## 2017-04-02 DIAGNOSIS — F17210 Nicotine dependence, cigarettes, uncomplicated: Secondary | ICD-10-CM | POA: Diagnosis present

## 2017-04-02 DIAGNOSIS — I5022 Chronic systolic (congestive) heart failure: Secondary | ICD-10-CM | POA: Diagnosis present

## 2017-04-02 DIAGNOSIS — Z66 Do not resuscitate: Secondary | ICD-10-CM | POA: Diagnosis present

## 2017-04-02 DIAGNOSIS — Z955 Presence of coronary angioplasty implant and graft: Secondary | ICD-10-CM | POA: Diagnosis not present

## 2017-04-02 DIAGNOSIS — I2583 Coronary atherosclerosis due to lipid rich plaque: Secondary | ICD-10-CM | POA: Diagnosis not present

## 2017-04-02 DIAGNOSIS — I700 Atherosclerosis of aorta: Secondary | ICD-10-CM | POA: Diagnosis present

## 2017-04-02 DIAGNOSIS — R066 Hiccough: Secondary | ICD-10-CM | POA: Diagnosis present

## 2017-04-02 DIAGNOSIS — Z7902 Long term (current) use of antithrombotics/antiplatelets: Secondary | ICD-10-CM | POA: Diagnosis not present

## 2017-04-02 DIAGNOSIS — I110 Hypertensive heart disease with heart failure: Secondary | ICD-10-CM | POA: Diagnosis present

## 2017-04-02 DIAGNOSIS — J181 Lobar pneumonia, unspecified organism: Secondary | ICD-10-CM | POA: Diagnosis not present

## 2017-04-02 DIAGNOSIS — Z7982 Long term (current) use of aspirin: Secondary | ICD-10-CM | POA: Diagnosis not present

## 2017-04-02 HISTORY — DX: Hypokalemia: E87.6

## 2017-04-02 HISTORY — DX: Pneumonia, unspecified organism: J18.9

## 2017-04-02 HISTORY — DX: Essential (primary) hypertension: I10

## 2017-04-02 LAB — STREP PNEUMONIAE URINARY ANTIGEN: Strep Pneumo Urinary Antigen: NEGATIVE

## 2017-04-02 MED ORDER — NICOTINE 21 MG/24HR TD PT24
21.0000 mg | MEDICATED_PATCH | Freq: Every day | TRANSDERMAL | Status: DC
  Administered 2017-04-02 – 2017-04-04 (×3): 21 mg via TRANSDERMAL
  Filled 2017-04-02 (×3): qty 1

## 2017-04-02 MED ORDER — CHLORPROMAZINE HCL 25 MG PO TABS
25.0000 mg | ORAL_TABLET | Freq: Once | ORAL | Status: AC
  Administered 2017-04-02: 25 mg via ORAL
  Filled 2017-04-02: qty 1

## 2017-04-02 MED ORDER — SODIUM CHLORIDE 0.9 % IV SOLN
INTRAVENOUS | Status: AC
  Administered 2017-04-02: 04:00:00 via INTRAVENOUS

## 2017-04-02 MED ORDER — DIAZEPAM 5 MG PO TABS
5.0000 mg | ORAL_TABLET | Freq: Three times a day (TID) | ORAL | Status: DC | PRN
Start: 2017-04-02 — End: 2017-04-04
  Administered 2017-04-02: 5 mg via ORAL
  Filled 2017-04-02: qty 1

## 2017-04-02 MED ORDER — METOCLOPRAMIDE HCL 5 MG/ML IJ SOLN: 10.0000 mg | Freq: Once | INTRAMUSCULAR | Status: DC

## 2017-04-02 MED ORDER — BUSPIRONE HCL 15 MG PO TABS
15.0000 mg | ORAL_TABLET | Freq: Every evening | ORAL | Status: DC
  Administered 2017-04-02 – 2017-04-03 (×2): 15 mg via ORAL
  Filled 2017-04-02: qty 1.5
  Filled 2017-04-02 (×3): qty 1

## 2017-04-02 MED ORDER — ATORVASTATIN CALCIUM 40 MG PO TABS
40.0000 mg | ORAL_TABLET | Freq: Every day | ORAL | Status: DC
  Administered 2017-04-02 – 2017-04-03 (×2): 40 mg via ORAL
  Filled 2017-04-02 (×2): qty 1

## 2017-04-02 MED ORDER — POLYETHYLENE GLYCOL 3350 17 G PO PACK
17.0000 g | PACK | Freq: Every day | ORAL | Status: DC | PRN
  Administered 2017-04-03 – 2017-04-04 (×2): 17 g via ORAL
  Filled 2017-04-02 (×2): qty 1

## 2017-04-02 MED ORDER — CHLORPROMAZINE HCL 25 MG PO TABS
25.0000 mg | ORAL_TABLET | Freq: Three times a day (TID) | ORAL | Status: DC | PRN
  Administered 2017-04-03 – 2017-04-04 (×3): 25 mg via ORAL
  Filled 2017-04-02 (×6): qty 1

## 2017-04-02 MED ORDER — POTASSIUM CHLORIDE CRYS ER 20 MEQ PO TBCR
40.0000 meq | EXTENDED_RELEASE_TABLET | Freq: Once | ORAL | Status: AC
  Administered 2017-04-02: 40 meq via ORAL
  Filled 2017-04-02: qty 2

## 2017-04-02 MED ORDER — PROMETHAZINE HCL 25 MG PO TABS
12.5000 mg | ORAL_TABLET | Freq: Four times a day (QID) | ORAL | Status: DC | PRN
  Administered 2017-04-03: 12.5 mg via ORAL
  Filled 2017-04-02: qty 1

## 2017-04-02 MED ORDER — OXYCODONE-ACETAMINOPHEN 5-325 MG PO TABS: 1.0000 | ORAL_TABLET | ORAL | Status: DC | PRN

## 2017-04-02 MED ORDER — PANTOPRAZOLE SODIUM 40 MG PO TBEC
40.0000 mg | DELAYED_RELEASE_TABLET | Freq: Every day | ORAL | Status: DC
  Administered 2017-04-02 – 2017-04-04 (×3): 40 mg via ORAL
  Filled 2017-04-02 (×3): qty 1

## 2017-04-02 NOTE — H&P (Signed)
History and Physical  Patient Name: Craig Russell     WNU:[id number]    DOB: 1946-11-15    DOA: 04/01/2017 PCP: No primary care provider on file.   Patient coming from: Home  Chief Complaint: Fever, cough  HPI: Craig Russell is a 71 y.o. male with a past medical history significant for CAD s/p DES sep 2017, and HTN who presents with cough and fever.  The patient had outpatient laminectomy/foraminectomy on Tuesday at the surgery center here on [facility] by Dr. Ellene Route.  This went uneventfully, but Wednesday morning he woke with fever.  Through the day he developed productive cough, but couldn't get the sputum out, as well as hiccups. Today, he continued to have fever, productive cough, and now malaise, and called his surgeon's office who recommended he come to the ER.  No pain in his back, some reddish non-foul smelling discharge, no surrounding redness.  ED course: -Temp 100.9 F, heart rate 110, respirations 22, blood pressure 114/100, pulse oximetry normal on room air -Na 135, K 3.4, Cr 0.99 (baseline 1.0), WBC 7.3K, Hgb 12.6 -Lactate 0.84 -Urinalysis with 6-30 RBCs, no pyuria -Chest x-ray showed a patchy right lower lobe opacity -He was given Levaquin and TRH was asked to admit for pneumonia     ROS: Review of Systems  Constitutional: Positive for fever and malaise/fatigue.  Respiratory: Positive for cough and sputum production.   All other systems reviewed and are negative.         Past Medical History:  Diagnosis Date  . Chronic back pain   . Coronary artery disease    s/p 2 stents in 01/2002  . 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 HFrEF (heart failure with reduced ejection fraction) ([place]) 04/2016  . High cholesterol   . Tobacco abuse     Past Surgical History:  Procedure Laterality Date  . BACK SURGERY      Social History: Patient lives with his wife.  The patient walks unassisted.  He smokes.      No Known Allergies  Family history:  family history includes Cancer in his sister; Cirrhosis in his father; Heart failure in his brother; Pancreatic cancer in his mother.  Prior to Admission medications   Medication Sig Start Date End Date Taking? Authorizing Provider  albuterol (PROVENTIL HFA) 108 (90 Base) MCG/ACT inhaler Inhale 1-2 puffs into the lungs every 6 (six) hours as needed for wheezing or shortness of breath.    Historical Provider, MD  aspirin 81 MG chewable tablet Chew 81 mg by mouth daily. Reported on 05/09/2016    Historical Provider, MD  atorvastatin (LIPITOR) 40 MG tablet Take 1 tablet (40 mg total) by mouth daily at 6 PM. 05/11/16   Jule Ser, DO  busPIRone (BUSPAR) 15 MG tablet Take 15 mg by mouth every evening. Reported on 05/09/2016     Historical Provider, MD  nicotine (NICODERM CQ - DOSED IN MG/24 HOURS) 21 mg/24hr patch Place 1 patch (21 mg total) onto the skin daily. 05/11/16   Jule Ser, DO  nitroGLYCERIN (NITROSTAT) 0.4 MG SL tablet Place 0.4 mg under the tongue every 5 (five) minutes as needed for chest pain. Reported on 05/09/2016    Historical Provider, MD  pantoprazole (PROTONIX) 40 MG tablet Take 1 tablet (40 mg total) by mouth daily. 05/11/16   Jule Ser, DO       Physical Exam: BP 123/71   Pulse 83   Temp 98.9 F (37.2 C) (Oral)  Resp (!) 22   Ht 5\' 7"  (1.702 m)   Wt 74.8 kg (165 lb)   SpO2 96%   BMI 25.84 kg/m  General appearance: Well-developed, adult male, alert and in no acute distress, uncomfortable with hiccups.   Eyes: Anicteric, conjunctiva pink, lids and lashes normal. PERRL.    ENT: No nasal deformity, discharge, epistaxis.  Hearing normal. OP moist without lesions.   Neck: No neck masses.  Trachea midline.  No thyromegaly/tenderness. Lymph: No cervical or supraclavicular lymphadenopathy. Skin: Warm and dry.  No jaundice.  No suspicious rashes or lesions. Cardiac: RRR, nl S1-S2, no murmurs appreciated.  Capillary refill is brisk.  JVP normal  No LE edema.  Radial  and DP pulses 2+ and symmetric. Respiratory: Normal respiratory rate and rhythm.  Uncomfortable from hiccups.  CTAB without rales or wheezes actually. Abdomen: Abdomen soft.  No TTP. No ascites, distension, hepatosplenomegaly.   MSK: No deformities or effusions.  No cyanosis or clubbing.  The back wound has no surrounding redness.  There is some bloody drainage, but not foul smelling. Neuro: Cranial nerves Normal.  Sensation intact to light touch. Speech is fluent.  Muscle strength normal.    Psych: Sensorium intact and responding to questions, attention normal.  Behavior appropriate.  Affect normal.  Judgment and insight appear normal.     Labs on Admission:  I have personally reviewed following labs and imaging studies: CBC:  Recent Labs Lab 04/01/17 1856  WBC 7.3  NEUTROABS 5.2  HGB 12.6*  HCT 36.5*  MCV 89.0  PLT 093   Basic Metabolic Panel:  Recent Labs Lab 04/01/17 1856  NA 135  K 3.4*  CL 102  CO2 23  GLUCOSE 101*  BUN 11  CREATININE 0.99  CALCIUM 8.9   GFR: Estimated Creatinine Clearance: 63.1 mL/min (by C-G formula based on SCr of 0.99 mg/dL).  Liver Function Tests:  Recent Labs Lab 04/01/17 1856  AST 22  ALT 15*  ALKPHOS 74  BILITOT 1.2  PROT 6.8  ALBUMIN 3.4*   No results for input(s): LIPASE, AMYLASE in the last 168 hours. No results for input(s): AMMONIA in the last 168 hours. Coagulation Profile: No results for input(s): INR, PROTIME in the last 168 hours. Cardiac Enzymes: No results for input(s): CKTOTAL, CKMB, CKMBINDEX, TROPONINI in the last 168 hours. BNP (last 3 results) No results for input(s): PROBNP in the last 8760 hours. HbA1C: No results for input(s): HGBA1C in the last 72 hours. CBG: No results for input(s): GLUCAP in the last 168 hours. Lipid Profile: No results for input(s): CHOL, HDL, LDLCALC, TRIG, CHOLHDL, LDLDIRECT in the last 72 hours. Thyroid Function Tests: No results for input(s): TSH, T4TOTAL, FREET4, T3FREE,  THYROIDAB in the last 72 hours. Anemia Panel: No results for input(s): VITAMINB12, FOLATE, FERRITIN, TIBC, IRON, RETICCTPCT in the last 72 hours. Sepsis Labs: Lactic acid 0.84 Invalid input(s): PROCALCITONIN, LACTICIDVEN No results found for this or any previous visit (from the past 240 hour(s)).       Radiological Exams on Admission: Personally reviewed CXR shows scant right lower lobe opacity, hyperexpansion: Dg Chest 2 View  Result Date: 04/01/2017 CLINICAL DATA:  Cough.  Fever.  Dyspnea. EXAM: CHEST  2 VIEW COMPARISON:  08/31/2016 chest radiograph. FINDINGS: Stable cardiomediastinal silhouette with normal heart size and aortic atherosclerosis. No pneumothorax. No pleural effusion. Hyperinflated lungs. No pulmonary edema. Mild hazy opacity in the basilar right lower lobe appears new. IMPRESSION: 1. Mild new hazy basilar right lower lobe opacity, which may represent  a developing pneumonia. Recommend follow-up PA and lateral post treatment chest radiographs in 4-6 weeks. 2. Hyperinflated lungs, suggesting COPD. 3. Aortic atherosclerosis. Electronically Signed   By: Ilona Sorrel M.D.   On: 04/01/2017 19:29      [place] 2017: Report reviewed. DES to mid LAD DAPT 6-12 months (that would be Mar 2018) ASA indefinitely LVEF 55%   Assessment/Plan  1. Community-acquired pneumonia:  -Levaquin IV -IVF -Strep pneumo urine antigens -Obtain sputum culture -Valium PRN for hiccups -IS   2. Hypokalemia:  Mild -Oral potassium  3. Coronary disease secondary prevention:  Off Plavix and aspirin postoperatively, we will resume in a few days per neurosurgery -Continue statin  4. Other medications:  -Continue PPI -Continue Buspar  5. Recent laminectomy:  -Percocet, phenergan PRN       DVT prophylaxis: SCDs  Code Status: DO NOT RESUSCITATE  Family Communication: Wife at bedside  Disposition Plan: Anticipate IV antibiotics, sputum culture, monitoring Consults called: None Admission  status: OBS At the point of initial evaluation, it is my clinical opinion that admission for OBSERVATION is reasonable and necessary because the patient's presenting complaints in the context of their chronic conditions represent sufficient risk of deterioration or significant morbidity to constitute reasonable grounds for close observation in the hospital setting, but that the patient may be medically stable for discharge from the hospital within 24 to 48 hours.    Medical decision making: Patient seen at 1:45 AM on 04/02/2017.  The patient was discussed with Harlene Ramus, PA-C.  What exists of the patient's chart was reviewed in depth and summarized above.  Clinical condition: stable.        Edwin Dada [facility] Pager [phone]

## 2017-04-02 NOTE — Progress Notes (Signed)
Pt admitted from ED per stretcher accompanied by nurse tech and pt wife, pt fully alert and oriented on arrival to the floor, self introduced to pt and family ID bracelet checked fall risk and fall prevention plan discussed with pt skin assessment done with another nurse vital signs are stable, prescribed treatment started call light and phone within reach pt able to demonstrate how to use them

## 2017-04-02 NOTE — ED Notes (Signed)
Dr. Inda Merlin at bedside at this time.

## 2017-04-02 NOTE — Progress Notes (Addendum)
Patient complaining of hiccups unrelieved since last Tuesday. Patient states he has not been able to sleep because of them. Valium given with no relief. MD paged and verbal order to give peanut butter (pt not allergic) and to call back if still unrelieved this afternoon. Patient also stated he smokes 1.5 packs of cigarettes a day and asking for patch. Verbal order received from MD.   1300: patient encouraged to use incentive spirometer in attempt to also help hiccups. IS achieved 727ml.   1430: patient still complaining of hiccups. MD made aware and orders placed.

## 2017-04-02 NOTE — ED Provider Notes (Signed)
Patient seen/examined in the Emergency Department in conjunction with Midlevel Provider [place] Patient reports fever/cough.  He had recent back surgery Exam : awake/alert, he is actively coughing with some vomiting while I am in room Plan: due to recent surgery, age and vomiting, I advised admission for IV antibiotics for pneumonia Currently it does not appear that this is a post-operative infection (denies new LE weakness/incontinence     Ripley Fraise, MD 04/02/17 [phone]

## 2017-04-02 NOTE — Progress Notes (Addendum)
Pharmacy consult: thorazine  71 yo male with hiccups and this has been interfering with sleep. Pharmacy has been consulted to dose thorazine  Plan -Thorazine 25mg  po tid PRN hiccups -If effective after the first 24hrs, will consider scheduled -If ineffective could increase to 50mg  po tid but side effects (drowsiness, etc) may limit ability to increase -Consider total duration no longer than 5 days to limit side effect potential  Thank you Hildred Laser, Pharm D 04/02/2017 2:50 PM

## 2017-04-02 NOTE — Progress Notes (Signed)
Patient seen and evaluated earlier this a.m. by my associate. Please refer to H&P for details regarding assessment and plan  Patient still complaining of hiccups and requesting medication. As such consulted pharmacy for assistance using Thorazine  We'll reassess next a.m.  Craig Russell, [employer]

## 2017-04-02 NOTE — ED Notes (Signed)
Patient given 1 tsp of pure sugar. Placed under tongue. Hiccups ceased.

## 2017-04-03 LAB — CBC
HEMATOCRIT: 36.7 % — AB (ref 39.0–52.0)
Hemoglobin: 12.5 g/dL — ABNORMAL LOW (ref 13.0–17.0)
MCH: 29.8 pg (ref 26.0–34.0)
MCHC: 34.1 g/dL (ref 30.0–36.0)
MCV: 87.6 fL (ref 78.0–100.0)
Platelets: 219 10*3/uL (ref 150–400)
RBC: 4.19 MIL/uL — ABNORMAL LOW (ref 4.22–5.81)
RDW: 13.7 % (ref 11.5–15.5)
WBC: 6 10*3/uL (ref 4.0–10.5)

## 2017-04-03 NOTE — Progress Notes (Signed)
PROGRESS NOTE    Craig Russell  GMW:[id number] DOB: 1946-09-20 DOA: 04/01/2017 PCP: No PCP Per Patient   Brief Narrative:  71 year old who presented with shortness of breath currently. Treated for community-acquired pneumonia.   Assessment & Plan:   Principal Problem:   Community acquired pneumonia of right lower lobe of lung ([place]) - Continue current antibiotic regimen - Recommended increasing ambulation  Active Problems:   Coronary artery disease due to lipid rich plaque - Stable no chest pain reported    Essential hypertension - Currently relatively well controlled    Hypokalemia - reassess next am. - Was only mildly low on last check. Harvest Dark tomorrow we'll plan on replacing    DVT prophylaxis: SCD's Code Status: Full Family Communication: None at bedside Disposition Plan: pending improvement in condition   Consultants:   None   Procedures: None  Antimicrobials:   Levaquin    Subjective: The patient has no new complaints. Breathing not quite at baseline  Objective: Vitals:   04/02/17 1403 04/02/17 2220 04/03/17 0536 04/03/17 1539  BP: (!) 102/58 130/70 (!) 106/55 134/73  Pulse: 70 73 66 83  Resp: 18 18 18  (!) 22  Temp: 98.5 F (36.9 C) 99 F (37.2 C) 97.8 F (36.6 C) 98.2 F (36.8 C)  TempSrc:    Oral  SpO2: 95% 96% 98% 99%  Weight:      Height:        Intake/Output Summary (Last 24 hours) at 04/03/17 1551 Last data filed at 04/03/17 0900  Gross per 24 hour  Intake              490 ml  Output                0 ml  Net              490 ml   Filed Weights   04/01/17 1839  Weight: 74.8 kg (165 lb)    Examination:  General exam: Appears calm and comfortable , No acute distress Respiratory system: equal chest rise no wheezes Cardiovascular system: S1 & S2 heard, RRR. No JVD, murmurs, rubs, gallops or clicks. No pedal edema. Gastrointestinal system: Abdomen is nondistended, soft and nontender. No organomegaly or masses felt. Normal  bowel sounds heard. Central nervous system: Alert and oriented. No focal neurological deficits. Extremities: Symmetric 5 x 5 power. Skin: No rashes, lesions or ulcers Psychiatry: Judgement and insight appear normal. Mood & affect appropriate.   Data Reviewed: I have personally reviewed following labs and imaging studies  CBC:  Recent Labs Lab 04/01/17 1856  WBC 7.3  NEUTROABS 5.2  HGB 12.6*  HCT 36.5*  MCV 89.0  PLT 440   Basic Metabolic Panel:  Recent Labs Lab 04/01/17 1856  NA 135  K 3.4*  CL 102  CO2 23  GLUCOSE 101*  BUN 11  CREATININE 0.99  CALCIUM 8.9   GFR: Estimated Creatinine Clearance: 63.1 mL/min (by C-G formula based on SCr of 0.99 mg/dL). Liver Function Tests:  Recent Labs Lab 04/01/17 1856  AST 22  ALT 15*  ALKPHOS 74  BILITOT 1.2  PROT 6.8  ALBUMIN 3.4*   No results for input(s): LIPASE, AMYLASE in the last 168 hours. No results for input(s): AMMONIA in the last 168 hours. Coagulation Profile: No results for input(s): INR, PROTIME in the last 168 hours. Cardiac Enzymes: No results for input(s): CKTOTAL, CKMB, CKMBINDEX, TROPONINI in the last 168 hours. BNP (last 3 results) No results for input(s): PROBNP in  the last 8760 hours. HbA1C: No results for input(s): HGBA1C in the last 72 hours. CBG: No results for input(s): GLUCAP in the last 168 hours. Lipid Profile: No results for input(s): CHOL, HDL, LDLCALC, TRIG, CHOLHDL, LDLDIRECT in the last 72 hours. Thyroid Function Tests: No results for input(s): TSH, T4TOTAL, FREET4, T3FREE, THYROIDAB in the last 72 hours. Anemia Panel: No results for input(s): VITAMINB12, FOLATE, FERRITIN, TIBC, IRON, RETICCTPCT in the last 72 hours. Sepsis Labs:  Recent Labs Lab 04/01/17 1902  LATICACIDVEN 0.83    Recent Results (from the past 240 hour(s))  Culture, blood (routine x 2)     Status: None (Preliminary result)   Collection Time: 04/01/17 10:40 PM  Result Value Ref Range Status   Specimen  Description BLOOD RIGHT ARM  Final   Special Requests   Final    BOTTLES DRAWN AEROBIC AND ANAEROBIC Blood Culture adequate volume   Culture NO GROWTH 1 DAY  Final   Report Status PENDING  Incomplete  Culture, blood (routine x 2)     Status: None (Preliminary result)   Collection Time: 04/01/17 10:45 PM  Result Value Ref Range Status   Specimen Description BLOOD LEFT ARM  Final   Special Requests   Final    BOTTLES DRAWN AEROBIC AND ANAEROBIC Blood Culture adequate volume   Culture NO GROWTH 1 DAY  Final   Report Status PENDING  Incomplete    Radiology Studies: Dg Chest 2 View  Result Date: 04/01/2017 CLINICAL DATA:  Cough.  Fever.  Dyspnea. EXAM: CHEST  2 VIEW COMPARISON:  08/31/2016 chest radiograph. FINDINGS: Stable cardiomediastinal silhouette with normal heart size and aortic atherosclerosis. No pneumothorax. No pleural effusion. Hyperinflated lungs. No pulmonary edema. Mild hazy opacity in the basilar right lower lobe appears new. IMPRESSION: 1. Mild new hazy basilar right lower lobe opacity, which may represent a developing pneumonia. Recommend follow-up PA and lateral post treatment chest radiographs in 4-6 weeks. 2. Hyperinflated lungs, suggesting COPD. 3. Aortic atherosclerosis. Electronically Signed   By: Ilona Sorrel M.D.   On: 04/01/2017 19:29    Scheduled Meds: . atorvastatin  40 mg Oral q1800  . busPIRone  15 mg Oral QPM  . levofloxacin (LEVAQUIN) IV  750 mg Intravenous Q24H  . nicotine  21 mg Transdermal Daily  . pantoprazole  40 mg Oral Daily   Continuous Infusions:   LOS: 1 day    Time spent: > 35 minutes  Velvet Bathe, MD [facility] Pager [phone]  If 7PM-7AM, please contact night-coverage [email or web address] Password [facility] 04/03/2017, 3:51 PM

## 2017-04-04 LAB — BASIC METABOLIC PANEL
Anion gap: 10 (ref 5–15)
BUN: 10 mg/dL (ref 6–20)
CHLORIDE: 102 mmol/L (ref 101–111)
CO2: 25 mmol/L (ref 22–32)
CREATININE: 0.95 mg/dL (ref 0.61–1.24)
Calcium: 8.9 mg/dL (ref 8.9–10.3)
GFR calc Af Amer: >60 mL/min (ref >60)
GFR calc non Af Amer: >60 mL/min (ref >60)
GLUCOSE: 105 mg/dL — AB (ref 65–99)
Potassium: 3.6 mmol/L (ref 3.5–5.1)
SODIUM: 137 mmol/L (ref 135–145)

## 2017-04-04 MED ORDER — LEVOFLOXACIN 750 MG PO TABS: 750.0000 mg | ORAL_TABLET | Freq: Every day | ORAL | Status: DC

## 2017-04-04 MED ORDER — LEVOFLOXACIN 750 MG PO TABS: 750.0000 mg | ORAL_TABLET | Freq: Every day | ORAL | 0 refills | Status: AC

## 2017-04-04 MED ORDER — BUSPIRONE HCL 15 MG PO TABS
15.0000 mg | ORAL_TABLET | Freq: Two times a day (BID) | ORAL | Status: DC
  Administered 2017-04-04: 15 mg via ORAL

## 2017-04-04 NOTE — Progress Notes (Signed)
Craig Russell discharged Home with wife per MD order.  Discharge instructions reviewed and discussed with the patient, all questions and concerns answered. Copy of instructions and care notes for new diagnosis and medication given to patient, prescriptions sent to [facility] in [place].  Allergies as of 04/04/2017   No Known Allergies     Medication List    TAKE these medications   aspirin 81 MG chewable tablet Chew 81 mg by mouth daily. Reported on 05/09/2016   atorvastatin 40 MG tablet Commonly known as:  LIPITOR Take 1 tablet (40 mg total) by mouth daily at 6 PM.   busPIRone 15 MG tablet Commonly known as:  BUSPAR Take 15 mg by mouth 2 (two) times daily. Reported on 05/09/2016   CALCIUM-MAGNESIUM-ZINC PO Take 1 tablet by mouth every morning.   clopidogrel 75 MG tablet Commonly known as:  PLAVIX Take 75 mg by mouth daily.   diazepam 5 MG tablet Commonly known as:  VALIUM Take 5 mg by mouth every 4 (four) hours as needed for muscle spasms.   levofloxacin 750 MG tablet Commonly known as:  LEVAQUIN Take 1 tablet (750 mg total) by mouth daily. Start taking on:  04/05/2017   nicotine 21 mg/24hr patch Commonly known as:  NICODERM CQ - dosed in mg/24 hours Place 1 patch (21 mg total) onto the skin daily. What changed:  when to take this  reasons to take this   nitroGLYCERIN 0.4 MG SL tablet Commonly known as:  NITROSTAT Place 0.4 mg under the tongue every 5 (five) minutes as needed for chest pain. Reported on 05/09/2016   oxyCODONE-acetaminophen 5-325 MG tablet Commonly known as:  PERCOCET/ROXICET Take 1-2 tablets by mouth every 6 (six) hours as needed for severe pain.   pantoprazole 40 MG tablet Commonly known as:  PROTONIX Take 1 tablet (40 mg total) by mouth daily.   promethazine 25 MG tablet Commonly known as:  PHENERGAN Take 25 mg by mouth every 6 (six) hours as needed for nausea or vomiting.   PROVENTIL HFA 108 (90 Base) MCG/ACT inhaler Generic  drug:  albuterol Inhale 1-2 puffs into the lungs every 6 (six) hours as needed for wheezing or shortness of breath.       Patients skin is clean, dry and intact, no evidence of skin break down. Mid line back incision dressing changed. IV site discontinued and catheter remains intact. Site without signs and symptoms of complications. Dressing and pressure applied.  Patient escorted to car by NT in a wheelchair,  no distress noted upon discharge.  Craig Russell, Craig Russell 04/04/2017 3:40 PM

## 2017-04-04 NOTE — Progress Notes (Signed)
Pharmacy consult: thorazine  71 yo male with hiccups and this has been interfering with sleep. Pharmacy was asked to dose thorazine.  Medication started on 4/14. Last received a dose this morning at ~0600.  Plan -Thorazine 25mg  po tid PRN hiccups -Could increase to 50mg  po tid but side effects (drowsiness, etc) may limit ability to increase -Stop date for a total duration of 5 days enered to limit side effect potential with patient's age -Pharmacy to sign off, please re-consult if need arises  Aarin Bluett D. Hazen Brumett, PharmD, BCPS Clinical Pharmacist Pager: [phone] 04/04/2017 9:01 AM

## 2017-04-04 NOTE — Discharge Summary (Signed)
Physician Discharge Summary  Craig Russell ZOX:[id number] DOB: [birth year] DOA: 04/01/2017  PCP: No PCP Per Patient  Admit date: 04/01/2017 Discharge date: 04/04/2017  Time spent: > 35 minutes  Recommendations for Outpatient Follow-up:  1. Consider PFTs after pneumonia has resolved   Discharge Diagnoses:  Principal Problem:   Community acquired pneumonia of right lower lobe of lung ([place]) Active Problems:   Coronary artery disease due to lipid rich plaque   Essential hypertension   Hypokalemia   Discharge Condition: stable  Diet recommendation: Heart healthy  Filed Weights   04/01/17 1839  Weight: 74.8 kg (165 lb)    History of present illness:  71 year old who presented with shortness of breath currently. Treated for community-acquired pneumonia  Hospital Course:  Community-acquired pneumonia -Continue oral Levaquin as patient improved on this regimen.  Hiccups -Improved with Thorazine and has resolved  For other known medical conditions listed above continue home medication listed below  Hypokalemia -Resolved and within normal limits on last check  Procedures:  None  Consultations:  *None  Discharge Exam: Vitals:   04/03/17 2222 04/04/17 0621  BP: (!) 109/53 117/73  Pulse: 85 74  Resp: 18 18  Temp: 98.3 F (36.8 C) 98.2 F (36.8 C)    General: Patient in no acute distress, alert and awake Cardiovascular: No cyanosis Respiratory: No increased work of breathing, equal chest rise  Discharge Instructions   Discharge Instructions    Call MD for:  difficulty breathing, headache or visual disturbances    Complete by:  As directed    Call MD for:  temperature >100.4    Complete by:  As directed    Diet - low sodium heart healthy    Complete by:  As directed    Discharge instructions    Complete by:  As directed    Please follow-up with your primary care physician within the next 1 week for further evaluation recommendations   Increase activity  slowly    Complete by:  As directed      Current Discharge Medication List    START taking these medications   Details  levofloxacin (LEVAQUIN) 750 MG tablet Take 1 tablet (750 mg total) by mouth daily. Qty: 2 tablet, Refills: 0      CONTINUE these medications which have NOT CHANGED   Details  albuterol (PROVENTIL HFA) 108 (90 Base) MCG/ACT inhaler Inhale 1-2 puffs into the lungs every 6 (six) hours as needed for wheezing or shortness of breath.    atorvastatin (LIPITOR) 40 MG tablet Take 1 tablet (40 mg total) by mouth daily at 6 PM. Qty: 30 tablet, Refills: 2    busPIRone (BUSPAR) 15 MG tablet Take 15 mg by mouth 2 (two) times daily. Reported on 05/09/2016     CALCIUM-MAGNESIUM-ZINC PO Take 1 tablet by mouth every morning.    diazepam (VALIUM) 5 MG tablet Take 5 mg by mouth every 4 (four) hours as needed for muscle spasms.    nicotine (NICODERM CQ - DOSED IN MG/24 HOURS) 21 mg/24hr patch Place 1 patch (21 mg total) onto the skin daily. Qty: 28 patch, Refills: 0    nitroGLYCERIN (NITROSTAT) 0.4 MG SL tablet Place 0.4 mg under the tongue every 5 (five) minutes as needed for chest pain. Reported on 05/09/2016    oxyCODONE-acetaminophen (PERCOCET/ROXICET) 5-325 MG tablet Take 1-2 tablets by mouth every 6 (six) hours as needed for severe pain.    pantoprazole (PROTONIX) 40 MG tablet Take 1 tablet (40 mg total) by mouth daily.  Qty: 30 tablet, Refills: 0    promethazine (PHENERGAN) 25 MG tablet Take 25 mg by mouth every 6 (six) hours as needed for nausea or vomiting.    aspirin 81 MG chewable tablet Chew 81 mg by mouth daily. Reported on 05/09/2016    clopidogrel (PLAVIX) 75 MG tablet Take 75 mg by mouth daily.       No Known Allergies    The results of significant diagnostics from this hospitalization (including imaging, microbiology, ancillary and laboratory) are listed below for reference.    Significant Diagnostic Studies: Dg Chest 2 View  Result Date:  04/01/2017 CLINICAL DATA:  Cough.  Fever.  Dyspnea. EXAM: CHEST  2 VIEW COMPARISON:  08/31/2016 chest radiograph. FINDINGS: Stable cardiomediastinal silhouette with normal heart size and aortic atherosclerosis. No pneumothorax. No pleural effusion. Hyperinflated lungs. No pulmonary edema. Mild hazy opacity in the basilar right lower lobe appears new. IMPRESSION: 1. Mild new hazy basilar right lower lobe opacity, which may represent a developing pneumonia. Recommend follow-up PA and lateral post treatment chest radiographs in 4-6 weeks. 2. Hyperinflated lungs, suggesting COPD. 3. Aortic atherosclerosis. Electronically Signed   By: Ilona Sorrel M.D.   On: 04/01/2017 19:29    Microbiology: Recent Results (from the past 240 hour(s))  Culture, blood (routine x 2)     Status: None (Preliminary result)   Collection Time: 04/01/17 10:40 PM  Result Value Ref Range Status   Specimen Description BLOOD RIGHT ARM  Final   Special Requests   Final    BOTTLES DRAWN AEROBIC AND ANAEROBIC Blood Culture adequate volume   Culture NO GROWTH 1 DAY  Final   Report Status PENDING  Incomplete  Culture, blood (routine x 2)     Status: None (Preliminary result)   Collection Time: 04/01/17 10:45 PM  Result Value Ref Range Status   Specimen Description BLOOD LEFT ARM  Final   Special Requests   Final    BOTTLES DRAWN AEROBIC AND ANAEROBIC Blood Culture adequate volume   Culture NO GROWTH 1 DAY  Final   Report Status PENDING  Incomplete     Labs: Basic Metabolic Panel:  Recent Labs Lab 04/01/17 1856 04/03/17 2349  NA 135 137  K 3.4* 3.6  CL 102 102  CO2 23 25  GLUCOSE 101* 105*  BUN 11 10  CREATININE 0.99 0.95  CALCIUM 8.9 8.9   Liver Function Tests:  Recent Labs Lab 04/01/17 1856  AST 22  ALT 15*  ALKPHOS 74  BILITOT 1.2  PROT 6.8  ALBUMIN 3.4*   No results for input(s): LIPASE, AMYLASE in the last 168 hours. No results for input(s): AMMONIA in the last 168 hours. CBC:  Recent Labs Lab  04/01/17 1856 04/03/17 2349  WBC 7.3 6.0  NEUTROABS 5.2  --   HGB 12.6* 12.5*  HCT 36.5* 36.7*  MCV 89.0 87.6  PLT 167 219   Cardiac Enzymes: No results for input(s): CKTOTAL, CKMB, CKMBINDEX, TROPONINI in the last 168 hours. BNP: BNP (last 3 results) No results for input(s): BNP in the last 8760 hours.  ProBNP (last 3 results) No results for input(s): PROBNP in the last 8760 hours.  CBG: No results for input(s): GLUCAP in the last 168 hours.  Signed:  Velvet Bathe MD.  [facility] 04/04/2017, 1:01 PM

## 2017-04-04 NOTE — Progress Notes (Signed)
Patient dressing changed on back with minimal drainage.  Placed gauze and tape on back. Wilson Singer, RN

## 2017-04-07 LAB — CULTURE, BLOOD (ROUTINE X 2)
CULTURE: NO GROWTH
Culture: NO GROWTH
SPECIAL REQUESTS: ADEQUATE
Special Requests: ADEQUATE

## 2017-04-13 DIAGNOSIS — M7542 Impingement syndrome of left shoulder: Secondary | ICD-10-CM | POA: Diagnosis not present

## 2017-04-26 DIAGNOSIS — M7542 Impingement syndrome of left shoulder: Secondary | ICD-10-CM | POA: Diagnosis not present

## 2017-04-28 DIAGNOSIS — M7542 Impingement syndrome of left shoulder: Secondary | ICD-10-CM | POA: Diagnosis not present

## 2017-04-28 DIAGNOSIS — M75112 Incomplete rotator cuff tear or rupture of left shoulder, not specified as traumatic: Secondary | ICD-10-CM | POA: Diagnosis not present

## 2017-05-26 DIAGNOSIS — M48062 Spinal stenosis, lumbar region with neurogenic claudication: Secondary | ICD-10-CM | POA: Diagnosis not present

## 2017-06-29 DIAGNOSIS — E785 Hyperlipidemia, unspecified: Secondary | ICD-10-CM | POA: Diagnosis not present

## 2017-06-29 DIAGNOSIS — I2510 Atherosclerotic heart disease of native coronary artery without angina pectoris: Secondary | ICD-10-CM | POA: Diagnosis not present

## 2017-06-29 DIAGNOSIS — F411 Generalized anxiety disorder: Secondary | ICD-10-CM | POA: Diagnosis not present

## 2017-06-29 DIAGNOSIS — I472 Ventricular tachycardia: Secondary | ICD-10-CM | POA: Diagnosis not present

## 2017-06-29 DIAGNOSIS — I471 Supraventricular tachycardia: Secondary | ICD-10-CM | POA: Diagnosis not present

## 2017-06-29 DIAGNOSIS — K219 Gastro-esophageal reflux disease without esophagitis: Secondary | ICD-10-CM | POA: Diagnosis not present

## 2017-06-29 DIAGNOSIS — Z79899 Other long term (current) drug therapy: Secondary | ICD-10-CM | POA: Diagnosis not present

## 2017-06-29 DIAGNOSIS — I119 Hypertensive heart disease without heart failure: Secondary | ICD-10-CM | POA: Diagnosis not present

## 2017-06-29 DIAGNOSIS — Z23 Encounter for immunization: Secondary | ICD-10-CM | POA: Diagnosis not present

## 2017-06-29 DIAGNOSIS — Z1389 Encounter for screening for other disorder: Secondary | ICD-10-CM | POA: Diagnosis not present

## 2017-06-29 DIAGNOSIS — Z955 Presence of coronary angioplasty implant and graft: Secondary | ICD-10-CM | POA: Diagnosis not present

## 2017-09-13 DIAGNOSIS — I444 Left anterior fascicular block: Secondary | ICD-10-CM | POA: Insufficient documentation

## 2017-09-13 DIAGNOSIS — Z9119 Patient's noncompliance with other medical treatment and regimen: Secondary | ICD-10-CM

## 2017-09-13 DIAGNOSIS — F419 Anxiety disorder, unspecified: Secondary | ICD-10-CM | POA: Insufficient documentation

## 2017-09-13 DIAGNOSIS — K219 Gastro-esophageal reflux disease without esophagitis: Secondary | ICD-10-CM

## 2017-09-13 DIAGNOSIS — Z91199 Patient's noncompliance with other medical treatment and regimen due to unspecified reason: Secondary | ICD-10-CM

## 2017-09-13 HISTORY — DX: Patient's noncompliance with other medical treatment and regimen: Z91.19

## 2017-09-13 HISTORY — DX: Left anterior fascicular block: I44.4

## 2017-09-13 HISTORY — DX: Gastro-esophageal reflux disease without esophagitis: K21.9

## 2017-09-13 HISTORY — DX: Anxiety disorder, unspecified: F41.9

## 2017-09-13 HISTORY — DX: Patient's noncompliance with other medical treatment and regimen due to unspecified reason: Z91.199

## 2017-09-19 ENCOUNTER — Ambulatory Visit (INDEPENDENT_AMBULATORY_CARE_PROVIDER_SITE_OTHER): Payer: Medicare Other | Admitting: Cardiology

## 2017-09-19 ENCOUNTER — Encounter: Payer: Self-pay | Admitting: Cardiology

## 2017-09-19 ENCOUNTER — Telehealth (HOSPITAL_COMMUNITY): Payer: Self-pay | Admitting: *Deleted

## 2017-09-19 VITALS — BP 146/88 | HR 76 | Ht 67.0 in | Wt 164.1 lb

## 2017-09-19 DIAGNOSIS — E785 Hyperlipidemia, unspecified: Secondary | ICD-10-CM | POA: Diagnosis not present

## 2017-09-19 DIAGNOSIS — I2510 Atherosclerotic heart disease of native coronary artery without angina pectoris: Secondary | ICD-10-CM | POA: Diagnosis not present

## 2017-09-19 DIAGNOSIS — R55 Syncope and collapse: Secondary | ICD-10-CM

## 2017-09-19 DIAGNOSIS — I10 Essential (primary) hypertension: Secondary | ICD-10-CM | POA: Diagnosis not present

## 2017-09-19 NOTE — Progress Notes (Signed)
Cardiology Office Note:    Date:  09/19/2017   ID:  Craig Russell, DOB 06/14/1946, MRN [id number]  PCP:  Melony Overly, MD  Cardiologist:  Shirlee More, MD    Referring MD: Melony Overly, MD    ASSESSMENT:    1. Coronary artery disease involving native coronary artery of native heart without angina pectoris   2. Essential hypertension   3. Hyperlipidemia, unspecified hyperlipidemia type   4. Syncope, unspecified syncope type    PLAN:    In order of problems listed above:  1. Continue current treatment including long-term dual antiplatelet therapy with continued cigarette smoking, I've asked her to see intolerance and fatigue as anginal symptoms. If abnormal would benefit from coronary angiography and further revascularization 2. Stable continue current treatment 3. Stable continue his high intensity statin 4. Stable no clinical recurrence avoid rate limiting medications such as beta blocker   Next appointment: 1 year   Medication Adjustments/Labs and Tests Ordered: Current medicines are reviewed at length with the patient today.  Concerns regarding medicines are outlined above.  Orders Placed This Encounter  Procedures  . Myocardial Perfusion Imaging  . EKG 12-Lead   No orders of the defined types were placed in this encounter.   Chief Complaint  Patient presents with  . Follow-up    6 month flup   . Fatigue    "give out too easy"  . Coronary Artery Disease  . Hypertension  . Hyperlipidemia    History of Present Illness:    Craig Russell is a 71 y.o. male with a hx of syncope, CAD with remote PCI and S/P PCI of LAD with DES 08/31/16 last seen 6 months ago.he has had no angina shortness of breath palpitation or syncope. He does complain of fatigue and exercise intolerance Recent labs performed 6 months ago requested from his PCP Compliance with diet, lifestyle and medications:  yes Past Medical History:  Diagnosis Date  . Anxiety 09/13/2017  . CAD  (coronary artery disease) 05/09/2016   Overview:  Coronary artery disease  s/p PCI and 2 stents RCA in 01/2002  And CTO of D IMPRESSION: Cardiolite at [facility] 05/11/16 1. No reversible ischemia or infarction. 2. LEFT ventricular dilatation with global hypokinesia. 3. Left ventricular ejection fraction 45% 4. Non invasive risk stratification*: Intermediate risk (EF 35-49%)  Cardiac cath Sept 12 2017: Conclusions Diagnostic Summary LM Mild luminal irregularities <20% LAD: Mid LAD 70--75% LCX : Mild luminal irregularities <20% RCA: Patent stent mid Vessel 30-40% intstent stenosis Distal RCA 40-50% LVEF: 55% FFR 0.68 mid LAD IFR < 0.9 Diagnostic Recommendations PCI to the LAD Interventional Summary Successful PCI to the Mid LAD with 3.25 x 23 mm DES stent; Post dilated with 3.5 x 12 m [place] balloon Interventional Recommendations Dapt 6-12 months  . 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 Chronic back pain   . Chronic heart failure ([place]) 08/31/2016  . Closed head injury 12/21/2016  . Community acquired pneumonia of right lower lobe of lung ([place]) 04/02/2017  . Coronary artery disease    s/p 2 stents in 01/2002  . Coronary artery disease involving native coronary artery of native heart with angina pectoris ([place]) 08/31/2016   Overview:  Added automatically from request for surgery [id number]  . 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 Essential hypertension 04/02/2017  . GERD (gastroesophageal reflux disease) 09/13/2017  . HFrEF (heart  failure with reduced ejection fraction) ([place]) 04/2016  . High cholesterol   . Hyperlipidemia 05/09/2016  . Hypokalemia 04/02/2017  . Insomnia 12/21/2016  . Intractable episodic cluster headache 12/21/2016  . Left anterior fascicular block 09/13/2017  . Noncompliance 09/13/2017  . Other complicated headache syndrome 12/21/2016  . S/P coronary artery stent placement 05/09/2016  . SVT (supraventricular tachycardia)  ([place]) 08/31/2016  . Syncope 08/05/2016  . Tobacco abuse     Past Surgical History:  Procedure Laterality Date  . BACK SURGERY    . CARDIAC CATHETERIZATION  09/03/2016  . CATARACT EXTRACTION    . KNEE SURGERY    . SHOULDER SURGERY      Current Medications: Current Meds  Medication Sig  . albuterol (PROVENTIL HFA) 108 (90 Base) MCG/ACT inhaler Inhale 1-2 puffs into the lungs every 6 (six) hours as needed for wheezing or shortness of breath.  Marland Kitchen amitriptyline (ELAVIL) 25 MG tablet 1-4 a couple hours before bedtime  . aspirin 81 MG chewable tablet Chew 81 mg by mouth daily. Reported on 05/09/2016  . atorvastatin (LIPITOR) 40 MG tablet Take 1 tablet (40 mg total) by mouth daily at 6 PM.  . busPIRone (BUSPAR) 15 MG tablet Take 15 mg by mouth 2 (two) times daily. Reported on 05/09/2016   . CALCIUM-MAGNESIUM-ZINC PO Take 1 tablet by mouth every morning.  . clopidogrel (PLAVIX) 75 MG tablet Take 75 mg by mouth daily.  . nicotine (NICODERM CQ - DOSED IN MG/24 HOURS) 21 mg/24hr patch Place 1 patch (21 mg total) onto the skin daily. (Patient taking differently: Place 21 mg onto the skin daily as needed (nicotine dependence). )  . nitroGLYCERIN (NITROSTAT) 0.4 MG SL tablet Place 0.4 mg under the tongue every 5 (five) minutes as needed for chest pain. Reported on 05/09/2016  . pantoprazole (PROTONIX) 40 MG tablet Take 1 tablet (40 mg total) by mouth daily.     Allergies:   Patient has no known allergies.   Social History   Social History  . Marital status: Married    Spouse name: N/A  . Number of children: N/A  . Years of education: N/A   Occupational History  . part-time for [employer]    Social History Main Topics  . Smoking status: Current Every Day Smoker    Packs/day: 0.50    Years: 60.00    Types: Cigarettes  . Smokeless tobacco: Never Used  . Alcohol use No  . Drug use: No  . Sexual activity: Yes   Other Topics Concern  . 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 ROS:   Please see the history of present illness.    All other systems reviewed and are negative.  EKGs/Labs/Other Studies Reviewed:    The following studies were reviewed today:  EKG:  EKG ordered today.  The ekg ordered today demonstrates sinus rhythmnormal  Recent Labs: 04/01/2017: ALT 15 04/03/2017: BUN 10; Creatinine, Ser 0.95; Hemoglobin 12.5; Platelets 219; Potassium 3.6; Sodium 137  Recent Lipid Panel    Component Value Date/Time   CHOL 172 05/10/2016 0032   TRIG 78 05/10/2016 0032   HDL 48 05/10/2016 0032   CHOLHDL 3.6 05/10/2016 0032   VLDL 16 05/10/2016 0032   LDLCALC  108 (H) 05/10/2016 0032    Physical Exam:    VS:  BP (!) 146/88 (BP Location: Right Arm, Patient Position: Sitting)   Pulse 76   Ht 5\' 7"  (1.702 m)   Wt 164 lb 1.9 oz (74.4 kg)   SpO2 97%   BMI 25.70 kg/m     Wt Readings from Last 3 Encounters:  09/19/17 164 lb 1.9 oz (74.4 kg)  04/01/17 165 lb (74.8 kg)  08/04/16 155 lb (70.3 kg)     GEN:   Well nourished, well developed in no acute distress HEENT: Normal NECK: No JVD; No carotid bruits LYMPHATICS: No lymphadenopathy CARDIAC: RRR, no murmurs, rubs, gallops RESPIRATORY:  Clear to auscultation without rales, wheezing or rhonchi  ABDOMEN: Soft, non-tender, non-distended MUSCULOSKELETAL:  No edema; No deformity  SKIN: Warm and dry NEUROLOGIC:  Alert and oriented x 3 PSYCHIATRIC:  Normal affect    Signed, Shirlee More, MD  09/19/2017 9:26 AM    [place]

## 2017-09-19 NOTE — Patient Instructions (Addendum)
Medication Instructions:  Your physician recommends that you continue on your current medications as directed. Please refer to the Current Medication list given to you today.  Labwork: None   Testing/Procedures: Your physician has requested that you have en exercise stress myoview. For further information please visit [email or web address]. Please follow instruction sheet, as given.  Follow-Up: Your physician wants you to follow-up in: 1 year. You will receive a reminder letter in the mail two months in advance. If you don't receive a letter, please call our office to schedule the follow-up appointment.  Any Other Special Instructions Will Be Listed Below (If Applicable).  Please note that any paperwork needing to be filled out by the provider will need to be addressed at the front desk prior to seeing the provider. Please note that any paperwork FMLA, Disability or other documents regarding health condition is subject to a $25.00 charge that must be received prior to completion of paperwork in the form of a money order or check.    If you need a refill on your cardiac medications before your next appointment, please call your pharmacy.

## 2017-09-19 NOTE — Telephone Encounter (Signed)
Left message on voicemail per DPR in reference to upcoming appointment scheduled on 09/21/17 with detailed instructions given per Myocardial Perfusion Study Information Sheet for the test. LM to arrive 15 minutes early, and that it is imperative to arrive on time for appointment to keep from having the test rescheduled. If you need to cancel or reschedule your appointment, please call the office within 24 hours of your appointment. Failure to do so may result in a cancellation of your appointment, and a $50 no show fee. Phone number given for call back for any questions. Kirstie Peri

## 2017-09-21 ENCOUNTER — Ambulatory Visit (HOSPITAL_COMMUNITY): Payer: Medicare Other | Attending: Cardiology

## 2017-09-21 DIAGNOSIS — I2510 Atherosclerotic heart disease of native coronary artery without angina pectoris: Secondary | ICD-10-CM | POA: Diagnosis not present

## 2017-09-21 DIAGNOSIS — R55 Syncope and collapse: Secondary | ICD-10-CM | POA: Diagnosis not present

## 2017-09-21 DIAGNOSIS — R5383 Other fatigue: Secondary | ICD-10-CM | POA: Insufficient documentation

## 2017-09-21 DIAGNOSIS — E785 Hyperlipidemia, unspecified: Secondary | ICD-10-CM | POA: Diagnosis not present

## 2017-09-21 DIAGNOSIS — I10 Essential (primary) hypertension: Secondary | ICD-10-CM | POA: Diagnosis not present

## 2017-09-21 LAB — MYOCARDIAL PERFUSION IMAGING
CHL CUP MPHR: 147 {beats}/min
CHL CUP NUCLEAR SDS: 2
CHL CUP NUCLEAR SRS: 5
CHL CUP NUCLEAR SSS: 7
CSEPED: 7 min
CSEPHR: 86 %
CSEPPHR: 127 {beats}/min
Estimated workload: 9.3 METS
Exercise duration (sec): 31 s
LV dias vol: 108 mL (ref 62–150)
LV sys vol: 55 mL
RATE: 0.32
RPE: 18
Rest HR: 63 {beats}/min
TID: 0.89

## 2017-09-21 IMAGING — NM NM MISC PROCEDURE
9 series · 54 of 54 positions shown · non-contrast
Comparison: none

[Series 1: wbr_s-proj_st stress_(id)_sa · 6.5mm · 6.51mm/px · 6 of 512 frames shown (1 of 2)]
[frame 43/512]
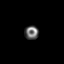
[frame 128/512]
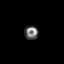
[frame 214/512]
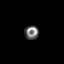
[frame 299/512]
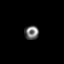
[frame 384/512]
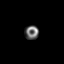
[frame 470/512]
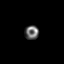

[Series 1: wbr_r-proj_st rest · 6.51mm/px · 6 of 64 frames shown]
[frame 6/64]
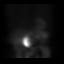
[frame 16/64]
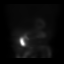
[frame 27/64]
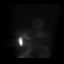
[frame 38/64]
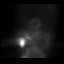
[frame 48/64]
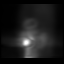
[frame 59/64]
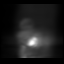

[Series 1: wbr_s-proj_st stress_(id)_sa · 6.5mm · 6.51mm/px · 6 of 64 frames shown (2 of 2)]
[frame 6/64]
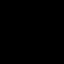
[frame 16/64]
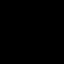
[frame 27/64]
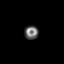
[frame 38/64]
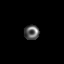
[frame 48/64]
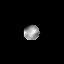
[frame 59/64]
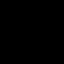

[Series 1: rest · 6.51mm/px · 6 of 64 frames shown]
[frame 6/64]
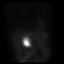
[frame 16/64]
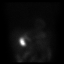
[frame 27/64]
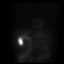
[frame 38/64]
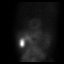
[frame 48/64]
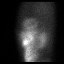
[frame 59/64]
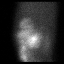

[Series 1: wbr_r-proj_st rest_(id)_sa · 6.5mm · 6.51mm/px · 6 of 64 frames shown]
[frame 6/64]
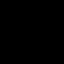
[frame 16/64]
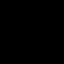
[frame 27/64]
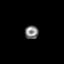
[frame 38/64]
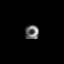
[frame 48/64]
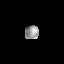
[frame 59/64]
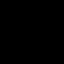

[Series 2: stress · 6.51mm/px · 6 of 64 frames shown (1 of 2)]
[frame 6/64]
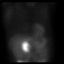
[frame 16/64]
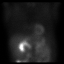
[frame 27/64]
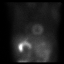
[frame 38/64]
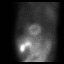
[frame 48/64]
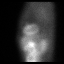
[frame 59/64]
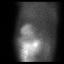

[Series 2: wbr_s-proj_st stress · 6.51mm/px · 6 of 512 frames shown (1 of 2)]
[frame 43/512]
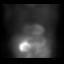
[frame 128/512]
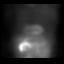
[frame 214/512]
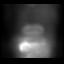
[frame 299/512]
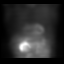
[frame 384/512]
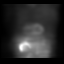
[frame 470/512]
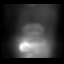

[Series 2: stress · 6.51mm/px · 6 of 511 frames shown (2 of 2)]
[frame 43/511]
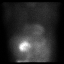
[frame 128/511]
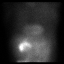
[frame 213/511]
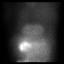
[frame 298/511]
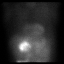
[frame 383/511]
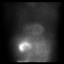
[frame 469/511]
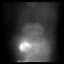

[Series 2: wbr_s-proj_st stress · 6.51mm/px · 6 of 64 frames shown (2 of 2)]
[frame 6/64]
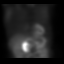
[frame 16/64]
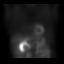
[frame 27/64]
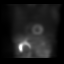
[frame 38/64]
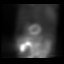
[frame 48/64]
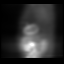
[frame 59/64]
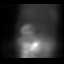

[54 of 54 positions shown; findings below may reference images not displayed]

Canned report from images found in remote index.

Refer to host system for actual result text.

## 2017-09-21 MED ORDER — TECHNETIUM TC 99M TETROFOSMIN IV KIT
32.8000 | PACK | Freq: Once | INTRAVENOUS | Status: AC | PRN
  Administered 2017-09-21: 32.8 via INTRAVENOUS
  Filled 2017-09-21: qty 33

## 2017-09-21 MED ORDER — TECHNETIUM TC 99M TETROFOSMIN IV KIT
10.8000 | PACK | Freq: Once | INTRAVENOUS | Status: AC | PRN
  Administered 2017-09-21: 10.8 via INTRAVENOUS
  Filled 2017-09-21: qty 11

## 2017-09-27 ENCOUNTER — Encounter: Payer: Self-pay | Admitting: Cardiology

## 2017-09-27 ENCOUNTER — Ambulatory Visit (HOSPITAL_BASED_OUTPATIENT_CLINIC_OR_DEPARTMENT_OTHER)
Admission: RE | Admit: 2017-09-27 | Discharge: 2017-09-27 | Disposition: A | Discharge status: Home / Self Care | Payer: Medicare Other | Source: Ambulatory Visit | Attending: Cardiology | Admitting: Cardiology

## 2017-09-27 ENCOUNTER — Ambulatory Visit (INDEPENDENT_AMBULATORY_CARE_PROVIDER_SITE_OTHER): Payer: Medicare Other | Admitting: Cardiology

## 2017-09-27 VITALS — BP 134/80 | HR 70 | Ht 67.0 in | Wt 161.1 lb

## 2017-09-27 DIAGNOSIS — R918 Other nonspecific abnormal finding of lung field: Secondary | ICD-10-CM | POA: Diagnosis not present

## 2017-09-27 DIAGNOSIS — I25118 Atherosclerotic heart disease of native coronary artery with other forms of angina pectoris: Secondary | ICD-10-CM

## 2017-09-27 DIAGNOSIS — I700 Atherosclerosis of aorta: Secondary | ICD-10-CM | POA: Insufficient documentation

## 2017-09-27 DIAGNOSIS — I429 Cardiomyopathy, unspecified: Secondary | ICD-10-CM

## 2017-09-27 DIAGNOSIS — I10 Essential (primary) hypertension: Secondary | ICD-10-CM | POA: Diagnosis not present

## 2017-09-27 DIAGNOSIS — E785 Hyperlipidemia, unspecified: Secondary | ICD-10-CM | POA: Diagnosis not present

## 2017-09-27 DIAGNOSIS — Z01810 Encounter for preprocedural cardiovascular examination: Secondary | ICD-10-CM | POA: Insufficient documentation

## 2017-09-27 NOTE — Patient Instructions (Signed)
Medication Instructions:  Your physician recommends that you continue on your current medications as directed. Please refer to the Current Medication list given to you today.  Labwork: Your physician recommends that you return for lab work in: today. BMP, CBC, pt/inr  Testing/Procedures: A chest x-ray takes a picture of the organs and structures inside the chest, including the heart, lungs, and blood vessels. This test can show several things, including, whether the heart is enlarges; whether fluid is building up in the lungs; and whether pacemaker / defibrillator leads are still in place.  Your physician has requested that you have a cardiac catheterization. Cardiac catheterization is used to diagnose and/or treat various heart conditions. Doctors may recommend this procedure for a number of different reasons. The most common reason is to evaluate chest pain. Chest pain can be a symptom of coronary artery disease (CAD), and cardiac catheterization can show whether plaque is narrowing or blocking your heart's arteries. This procedure is also used to evaluate the valves, as well as measure the blood flow and oxygen levels in different parts of your heart. For further information please visit [email or web address]. Please follow instruction sheet, as given.  Follow-Up: Your physician recommends that you schedule a follow-up appointment in: 4 weeks.   Any Other Special Instructions Will Be Listed Below (If Applicable).     If you need a refill on your cardiac medications before your next appointment, please call your pharmacy.

## 2017-09-27 NOTE — Progress Notes (Signed)
Cardiology Office Note:    Date:  09/27/2017   ID:  Craig Russell, DOB 05/18/1946, MRN [id number]  PCP:  Melony Overly, MD  Cardiologist:  Shirlee More, MD    Referring MD: Melony Overly, MD    ASSESSMENT:    1. Coronary artery disease of native artery of native heart with stable angina pectoris ([place])   2. Essential hypertension   3. Cardiomyopathy, secondary ([place])   4. Hyperlipidemia, unspecified hyperlipidemia type    PLAN:    In order of problems listed above:  1. He has continued atypical chest pain with an abnormal myocardial perfusion study and will undergo left heart catheterization and revascularization if there is flow limiting stenosis, were now continue current treatment including long-term dual antiplatelet therapy aspirin and clopidogrel and is not on a beta blocker with resting bradycardia 2. Stable continue current treatment 3. Stable ejection fraction mildly reduced 4. Stable continue his statin   Next appointment: 4 Weeks   Medication Adjustments/Labs and Tests Ordered: Current medicines are reviewed at length with the patient today.  Concerns regarding medicines are outlined above.  Orders Placed This Encounter  Procedures  . DG Chest 2 View  . CBC with Differential/Platelet  . Basic metabolic panel  . Protime-INR   No orders of the defined types were placed in this encounter.   Chief Complaint  Patient presents with  . Follow-up    to discuss abnormal stress test   . Fatigue  . Shortness of Breath    History of Present Illness:    Craig Russell is a 71 y.o. male with a hx of syncope, CAD with remote PCI of RCA in 2003 and S/P PCI of LAD with DES 08/31/16  last seen one week ago. Recent myocardial perfusion study was read as intermediate risk with mild right coronary artery ischemia and mildly reduced ejection fraction. Unfortunately the patient is not doing well he has a vague diffuse precordial aching with and without activities  and has not needed to use nitroglycerin. Despite being able to exercise for greater than 9 Mets and a treadmill stress test he finds that he fatigues easily and has a diminished ability to do his work. He has not had typical angina palpitation or syncope but does have exertional shortness of breath. Because of his known CAD and previous revascularization of his right coronary artery in 2003 I asked him to undergo diagnostic coronary angiography and if he has flow limiting stenosis revascularization. He has no dye allergy benefits riskand options detailed and he has decided to undergo left heart catheterization.He is compliant with medications is on long-term dual antiplatelet therapy. Compliance with diet, lifestyle and medications:  yes Past Medical History:  Diagnosis Date  . Anxiety 09/13/2017  . CAD (coronary artery disease) 05/09/2016   Overview:  Coronary artery disease  s/p PCI and 2 stents RCA in 01/2002  And CTO of D IMPRESSION: Cardiolite at [facility] 05/11/16 1. No reversible ischemia or infarction. 2. LEFT ventricular dilatation with global hypokinesia. 3. Left ventricular ejection fraction 45% 4. Non invasive risk stratification*: Intermediate risk (EF 35-49%)  Cardiac cath Sept 12 2017: Conclusions Diagnostic Summary LM Mild luminal irregularities <20% LAD: Mid LAD 70--75% LCX : Mild luminal irregularities <20% RCA: Patent stent mid Vessel 30-40% intstent stenosis Distal RCA 40-50% LVEF: 55% FFR 0.68 mid LAD IFR < 0.9 Diagnostic Recommendations PCI to the LAD Interventional Summary Successful PCI to the Mid LAD with 3.25 x 23 mm DES stent; Post  dilated with 3.5 x 12 m Crowley balloon Interventional Recommendations Dapt 6-12 months  . Cardiomyopathy, secondary ([place]) 08/31/2016   Overview:  EF was > 55% in 2010 by echo, normal LV size  . Chronic back pain   . Chronic heart failure ([place]) 08/31/2016  . Closed head injury 12/21/2016  . Community acquired pneumonia of right lower lobe of lung ([place]) 04/02/2017  .  Coronary artery disease    s/p 2 stents in 01/2002  . Coronary artery disease involving native coronary artery of native heart with angina pectoris ([place]) 08/31/2016   Overview:  Added automatically from request for surgery [id number]  . 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 Essential hypertension 04/02/2017  . GERD (gastroesophageal reflux disease) 09/13/2017  . HFrEF (heart failure with reduced ejection fraction) ([place]) 04/2016  . High cholesterol   . Hyperlipidemia 05/09/2016  . Hypokalemia 04/02/2017  . Insomnia 12/21/2016  . Intractable episodic cluster headache 12/21/2016  . Left anterior fascicular block 09/13/2017  . Noncompliance 09/13/2017  . Other complicated headache syndrome 12/21/2016  . S/P coronary artery stent placement 05/09/2016  . SVT (supraventricular tachycardia) ([place]) 08/31/2016  . Syncope 08/05/2016  . Tobacco abuse     Past Surgical History:  Procedure Laterality Date  . BACK SURGERY    . CARDIAC CATHETERIZATION  09/03/2016  . CATARACT EXTRACTION    . KNEE SURGERY    . SHOULDER SURGERY      Current Medications: Current Meds  Medication Sig  . albuterol (PROVENTIL HFA) 108 (90 Base) MCG/ACT inhaler Inhale 1-2 puffs into the lungs every 6 (six) hours as needed for wheezing or shortness of breath.  Marland Kitchen amitriptyline (ELAVIL) 25 MG tablet 1-4 a couple hours before bedtime  . aspirin 81 MG chewable tablet Chew 81 mg by mouth daily. Reported on 05/09/2016  . atorvastatin (LIPITOR) 40 MG tablet Take 1 tablet (40 mg total) by mouth daily at 6 PM.  . busPIRone (BUSPAR) 15 MG tablet Take 15 mg by mouth 2 (two) times daily. Reported on 05/09/2016   . CALCIUM-MAGNESIUM-ZINC PO Take 1 tablet by mouth every morning.  . clopidogrel (PLAVIX) 75 MG tablet Take 75 mg by mouth daily.  . nicotine (NICODERM CQ - DOSED IN MG/24 HOURS) 21 mg/24hr patch Place 1 patch (21 mg total) onto the skin daily. (Patient taking differently: Place 21 mg onto the skin daily as needed  (nicotine dependence). )  . nitroGLYCERIN (NITROSTAT) 0.4 MG SL tablet Place 0.4 mg under the tongue every 5 (five) minutes as needed for chest pain. Reported on 05/09/2016  . pantoprazole (PROTONIX) 40 MG tablet Take 1 tablet (40 mg total) by mouth daily.     Allergies:   Patient has no known allergies.   Social History   Social History  . Marital status: Married    Spouse name: N/A  . Number of children: N/A  . Years of education: N/A   Occupational History  . part-time for [employer]    Social History Main Topics  . Smoking status: Current Every Day Smoker    Packs/day: 0.50    Years: 60.00    Types: Cigarettes  . Smokeless tobacco: Never Used  . Alcohol use No  . Drug use: No  . Sexual activity: Yes   Other Topics Concern  . 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 ROS:   Please see the history of present illness.    All other systems reviewed and are negative.  EKGs/Labs/Other Studies Reviewed:    The following studies were reviewed today:  EKG:  09/19/17 [place] LAFB otherwise normal MPI 09/21/17:  Study Highlights    Response to Stress There was no ST segment deviation noted during stress at 9.3 Mets  Arrhythmias during stress: none.  Arrhythmias during recovery: none.  There were no significant arrhythmias noted during the test.  ECG was interpretable and there was no significant change from baseline.     Nuclear stress EF: 49%.  Blood pressure demonstrated a normal response to exercise.  There was no ST segment deviation noted during stress.  This is an intermediate risk study.  The left ventricular ejection fraction is mildly decreased (45-54%).   1. EF 49%, diffuse hypokinesis.  2. Reversible  small, mild basal inferior perfusion defect.  Possible small area of ischemia.   Intermediate risk study primarily due to low EF.     Recent Labs: 04/01/2017: ALT 15 04/03/2017: BUN 10; Creatinine, Ser 0.95; Hemoglobin 12.5; Platelets 219; Potassium 3.6; Sodium 137  Recent Lipid Panel    Component Value Date/Time   CHOL 172 05/10/2016 0032   TRIG 78 05/10/2016 0032   HDL 48 05/10/2016 0032   CHOLHDL 3.6 05/10/2016 0032   VLDL 16 05/10/2016 0032   LDLCALC 108 (H) 05/10/2016 0032    Physical Exam:    VS:  BP 134/80 (BP Location: Right Arm, Patient Position: Sitting)   Pulse 70   Ht 5\' 7"  (1.702 m)   Wt 161 lb 1.9 oz (73.1 kg)   SpO2 98%   BMI 25.23 kg/m     Wt Readings from Last 3 Encounters:  09/27/17 161 lb 1.9 oz (73.1 kg)  09/19/17 164 lb 1.9 oz (74.4 kg)  04/01/17 165 lb (74.8 kg)     GEN:   Well nourished, well developed in no acute distress HEENT: Normal NECK: No JVD; No carotid bruits LYMPHATICS: No lymphadenopathy CARDIAC:  RRR, no murmurs, rubs, gallops RESPIRATORY:  Clear to auscultation without rales, wheezing or rhonchi  ABDOMEN: Soft, non-tender, non-distended MUSCULOSKELETAL:  No edema; No deformity  SKIN: Warm and dry NEUROLOGIC:  Alert and oriented x 3 PSYCHIATRIC:  Normal affect    Signed, Shirlee More, MD  09/27/2017 10:15 AM    [place]

## 2017-09-28 ENCOUNTER — Telehealth: Payer: Self-pay

## 2017-09-28 LAB — CBC WITH DIFFERENTIAL/PLATELET
BASOS: 1 %
Basophils Absolute: 0 10*3/uL (ref 0.0–0.2)
EOS (ABSOLUTE): 0.2 10*3/uL (ref 0.0–0.4)
EOS: 3 %
HEMATOCRIT: 43.4 % (ref 37.5–51.0)
HEMOGLOBIN: 14.6 g/dL (ref 13.0–17.7)
IMMATURE GRANS (ABS): 0 10*3/uL (ref 0.0–0.1)
IMMATURE GRANULOCYTES: 0 %
LYMPHS: 37 %
Lymphocytes Absolute: 1.8 10*3/uL (ref 0.7–3.1)
MCH: 30.6 pg (ref 26.6–33.0)
MCHC: 33.6 g/dL (ref 31.5–35.7)
MCV: 91 fL (ref 79–97)
MONOCYTES: 8 %
Monocytes Absolute: 0.4 10*3/uL (ref 0.1–0.9)
NEUTROS PCT: 51 %
Neutrophils Absolute: 2.4 10*3/uL (ref 1.4–7.0)
Platelets: 178 10*3/uL (ref 150–379)
RBC: 4.77 x10E6/uL (ref 4.14–5.80)
RDW: 15 % (ref 12.3–15.4)
WBC: 4.8 10*3/uL (ref 3.4–10.8)

## 2017-09-28 LAB — BASIC METABOLIC PANEL
BUN/Creatinine Ratio: 9 — ABNORMAL LOW (ref 10–24)
BUN: 8 mg/dL (ref 8–27)
CALCIUM: 9.7 mg/dL (ref 8.6–10.2)
CO2: 24 mmol/L (ref 20–29)
CREATININE: 0.92 mg/dL (ref 0.76–1.27)
Chloride: 103 mmol/L (ref 96–106)
GFR calc Af Amer: 95 mL/min/{1.73_m2} (ref >59)
GFR, EST NON AFRICAN AMERICAN: 82 mL/min/{1.73_m2} (ref >59)
Glucose: 102 mg/dL — ABNORMAL HIGH (ref 65–99)
POTASSIUM: 4.2 mmol/L (ref 3.5–5.2)
Sodium: 140 mmol/L (ref 134–144)

## 2017-09-28 LAB — PROTIME-INR
INR: 0.9 (ref 0.8–1.2)
PROTHROMBIN TIME: 9.8 s (ref 9.1–12.0)

## 2017-09-28 NOTE — Telephone Encounter (Signed)
Patient contacted pre-catheterization at [facility] scheduled for:  09/30/2017 @ 1030 Verified arrival time and place:  NT @ 0800 Confirmed AM meds to be taken pre-cath with sip of water: Take ASA -Plavix Confirmed patient has responsible person to drive home post procedure and observe patient for 24 hours:  yes Addl concerns:  Left this nurse name and # if any further questions

## 2017-09-29 HISTORY — PX: CORONARY STENT INTERVENTION: CATH118234

## 2017-09-30 ENCOUNTER — Encounter (HOSPITAL_COMMUNITY): Payer: Self-pay | Admitting: General Practice

## 2017-09-30 ENCOUNTER — Ambulatory Visit (HOSPITAL_COMMUNITY)
Admission: RE | Admit: 2017-09-30 | Discharge: 2017-10-01 | Disposition: A | Discharge status: Home / Self Care | Payer: Medicare Other | Source: Ambulatory Visit | Attending: Cardiovascular Disease | Admitting: Cardiovascular Disease

## 2017-09-30 ENCOUNTER — Encounter (HOSPITAL_COMMUNITY)
Admission: RE | Disposition: A | Discharge status: Home / Self Care | Payer: Self-pay | Source: Ambulatory Visit | Attending: Cardiovascular Disease

## 2017-09-30 DIAGNOSIS — T82855A Stenosis of coronary artery stent, initial encounter: Secondary | ICD-10-CM | POA: Diagnosis not present

## 2017-09-30 DIAGNOSIS — Z955 Presence of coronary angioplasty implant and graft: Secondary | ICD-10-CM

## 2017-09-30 DIAGNOSIS — Z7902 Long term (current) use of antithrombotics/antiplatelets: Secondary | ICD-10-CM | POA: Insufficient documentation

## 2017-09-30 DIAGNOSIS — Y838 Other surgical procedures as the cause of abnormal reaction of the patient, or of later complication, without mention of misadventure at the time of the procedure: Secondary | ICD-10-CM | POA: Diagnosis not present

## 2017-09-30 DIAGNOSIS — Z720 Tobacco use: Secondary | ICD-10-CM | POA: Diagnosis present

## 2017-09-30 DIAGNOSIS — I429 Cardiomyopathy, unspecified: Secondary | ICD-10-CM | POA: Insufficient documentation

## 2017-09-30 DIAGNOSIS — F17210 Nicotine dependence, cigarettes, uncomplicated: Secondary | ICD-10-CM | POA: Insufficient documentation

## 2017-09-30 DIAGNOSIS — I25110 Atherosclerotic heart disease of native coronary artery with unstable angina pectoris: Secondary | ICD-10-CM | POA: Insufficient documentation

## 2017-09-30 DIAGNOSIS — Z7982 Long term (current) use of aspirin: Secondary | ICD-10-CM | POA: Insufficient documentation

## 2017-09-30 DIAGNOSIS — E785 Hyperlipidemia, unspecified: Secondary | ICD-10-CM | POA: Diagnosis present

## 2017-09-30 DIAGNOSIS — I2510 Atherosclerotic heart disease of native coronary artery without angina pectoris: Secondary | ICD-10-CM | POA: Diagnosis present

## 2017-09-30 DIAGNOSIS — I110 Hypertensive heart disease with heart failure: Secondary | ICD-10-CM | POA: Insufficient documentation

## 2017-09-30 DIAGNOSIS — Z79899 Other long term (current) drug therapy: Secondary | ICD-10-CM | POA: Diagnosis not present

## 2017-09-30 DIAGNOSIS — I25118 Atherosclerotic heart disease of native coronary artery with other forms of angina pectoris: Secondary | ICD-10-CM | POA: Diagnosis not present

## 2017-09-30 DIAGNOSIS — I200 Unstable angina: Secondary | ICD-10-CM

## 2017-09-30 DIAGNOSIS — Z9861 Coronary angioplasty status: Secondary | ICD-10-CM

## 2017-09-30 DIAGNOSIS — I5022 Chronic systolic (congestive) heart failure: Secondary | ICD-10-CM | POA: Insufficient documentation

## 2017-09-30 HISTORY — PX: CORONARY STENT INTERVENTION: CATH118234

## 2017-09-30 HISTORY — DX: Dyspnea, unspecified: R06.00

## 2017-09-30 HISTORY — PX: LEFT HEART CATH AND CORONARY ANGIOGRAPHY: CATH118249

## 2017-09-30 LAB — POCT ACTIVATED CLOTTING TIME: Activated Clotting Time: 406 seconds

## 2017-09-30 SURGERY — LEFT HEART CATH AND CORONARY ANGIOGRAPHY
Anesthesia: LOCAL

## 2017-09-30 MED ORDER — ATROPINE SULFATE 1 MG/10ML IJ SOSY
PREFILLED_SYRINGE | INTRAMUSCULAR | Status: AC
  Filled 2017-09-30: qty 10

## 2017-09-30 MED ORDER — BIVALIRUDIN TRIFLUOROACETATE 250 MG IV SOLR
INTRAVENOUS | Status: AC
  Filled 2017-09-30: qty 250

## 2017-09-30 MED ORDER — IOPAMIDOL (ISOVUE-370) INJECTION 76%
INTRAVENOUS | Status: AC
  Filled 2017-09-30: qty 100

## 2017-09-30 MED ORDER — ATORVASTATIN CALCIUM 80 MG PO TABS
80.0000 mg | ORAL_TABLET | Freq: Every day | ORAL | Status: DC
  Administered 2017-09-30: 80 mg via ORAL
  Filled 2017-09-30: qty 1

## 2017-09-30 MED ORDER — SODIUM CHLORIDE 0.9 % IV SOLN
INTRAVENOUS | Status: DC | PRN
  Administered 2017-09-30: 1.75 mg/kg/h via INTRAVENOUS

## 2017-09-30 MED ORDER — ASPIRIN 81 MG PO CHEW
81.0000 mg | CHEWABLE_TABLET | Freq: Every day | ORAL | Status: DC
  Administered 2017-10-01: 81 mg via ORAL
  Filled 2017-09-30: qty 1

## 2017-09-30 MED ORDER — SODIUM CHLORIDE 0.9 % IV SOLN: 250.0000 mL | INTRAVENOUS | Status: DC | PRN

## 2017-09-30 MED ORDER — HYDRALAZINE HCL 20 MG/ML IJ SOLN: 5.0000 mg | INTRAMUSCULAR | Status: AC | PRN

## 2017-09-30 MED ORDER — SODIUM CHLORIDE 0.9 % WEIGHT BASED INFUSION: 1.0000 mL/kg/h | INTRAVENOUS | Status: DC

## 2017-09-30 MED ORDER — HEPARIN (PORCINE) IN NACL 2-0.9 UNIT/ML-% IJ SOLN
INTRAMUSCULAR | Status: DC | PRN
  Administered 2017-09-30: 10 mL via INTRA_ARTERIAL

## 2017-09-30 MED ORDER — IOPAMIDOL (ISOVUE-370) INJECTION 76%
INTRAVENOUS | Status: DC | PRN
  Administered 2017-09-30: 250 mL via INTRA_ARTERIAL

## 2017-09-30 MED ORDER — SODIUM CHLORIDE 0.9 % WEIGHT BASED INFUSION: 3.0000 mL/kg/h | INTRAVENOUS | Status: DC

## 2017-09-30 MED ORDER — ATROPINE SULFATE 1 MG/10ML IJ SOSY
PREFILLED_SYRINGE | INTRAMUSCULAR | Status: DC | PRN
  Administered 2017-09-30: 0.5 mg via INTRAVENOUS

## 2017-09-30 MED ORDER — MIDAZOLAM HCL 2 MG/2ML IJ SOLN
INTRAMUSCULAR | Status: AC
  Filled 2017-09-30: qty 2

## 2017-09-30 MED ORDER — SODIUM CHLORIDE 0.9 % IV SOLN
INTRAVENOUS | Status: DC
  Administered 2017-09-30 (×2): via INTRAVENOUS

## 2017-09-30 MED ORDER — LIDOCAINE HCL (PF) 1 % IJ SOLN: INTRAMUSCULAR | Status: DC | PRN

## 2017-09-30 MED ORDER — HEPARIN SODIUM (PORCINE) 1000 UNIT/ML IJ SOLN
INTRAMUSCULAR | Status: DC | PRN
  Administered 2017-09-30: 4000 [IU] via INTRAVENOUS

## 2017-09-30 MED ORDER — SODIUM CHLORIDE 0.9% FLUSH
3.0000 mL | Freq: Two times a day (BID) | INTRAVENOUS | Status: DC
  Administered 2017-09-30: 3 mL via INTRAVENOUS

## 2017-09-30 MED ORDER — VERAPAMIL HCL 2.5 MG/ML IV SOLN
INTRAVENOUS | Status: AC
  Filled 2017-09-30: qty 2

## 2017-09-30 MED ORDER — SODIUM CHLORIDE 0.9% FLUSH: 3.0000 mL | Freq: Two times a day (BID) | INTRAVENOUS | Status: DC

## 2017-09-30 MED ORDER — NITROGLYCERIN 1 MG/10 ML FOR IR/CATH LAB
INTRA_ARTERIAL | Status: AC
  Filled 2017-09-30: qty 10

## 2017-09-30 MED ORDER — LIDOCAINE HCL 2 % IJ SOLN
INTRAMUSCULAR | Status: AC
  Filled 2017-09-30: qty 10

## 2017-09-30 MED ORDER — CLOPIDOGREL BISULFATE 75 MG PO TABS
ORAL_TABLET | ORAL | Status: AC
  Filled 2017-09-30: qty 2

## 2017-09-30 MED ORDER — SODIUM CHLORIDE 0.9% FLUSH: 3.0000 mL | INTRAVENOUS | Status: DC | PRN

## 2017-09-30 MED ORDER — FENTANYL CITRATE (PF) 100 MCG/2ML IJ SOLN
INTRAMUSCULAR | Status: AC
  Filled 2017-09-30: qty 2

## 2017-09-30 MED ORDER — HEPARIN (PORCINE) IN NACL 2-0.9 UNIT/ML-% IJ SOLN
INTRAMUSCULAR | Status: AC | PRN
  Administered 2017-09-30: 1000 mL

## 2017-09-30 MED ORDER — ASPIRIN 81 MG PO CHEW: 81.0000 mg | CHEWABLE_TABLET | ORAL | Status: DC

## 2017-09-30 MED ORDER — SODIUM CHLORIDE 0.9 % WEIGHT BASED INFUSION
1.0000 mL/kg/h | INTRAVENOUS | Status: DC
  Administered 2017-09-30 (×2): 250 mL via INTRAVENOUS
  Administered 2017-09-30: 50 mL via INTRAVENOUS

## 2017-09-30 MED ORDER — DIAZEPAM 5 MG PO TABS: 5.0000 mg | ORAL_TABLET | ORAL | Status: DC | PRN

## 2017-09-30 MED ORDER — CLOPIDOGREL BISULFATE 75 MG PO TABS
75.0000 mg | ORAL_TABLET | Freq: Every day | ORAL | Status: DC
  Administered 2017-10-01: 75 mg via ORAL
  Filled 2017-09-30: qty 1

## 2017-09-30 MED ORDER — NITROGLYCERIN 1 MG/10 ML FOR IR/CATH LAB
INTRA_ARTERIAL | Status: DC | PRN
  Administered 2017-09-30: 200 ug via INTRACORONARY
  Administered 2017-09-30: 100 ug via INTRACORONARY

## 2017-09-30 MED ORDER — LABETALOL HCL 5 MG/ML IV SOLN: 10.0000 mg | INTRAVENOUS | Status: AC | PRN

## 2017-09-30 MED ORDER — ONDANSETRON HCL 4 MG/2ML IJ SOLN: 4.0000 mg | Freq: Four times a day (QID) | INTRAMUSCULAR | Status: DC | PRN

## 2017-09-30 MED ORDER — IOPAMIDOL (ISOVUE-370) INJECTION 76%
INTRAVENOUS | Status: AC
  Filled 2017-09-30: qty 50

## 2017-09-30 MED ORDER — BIVALIRUDIN BOLUS VIA INFUSION - CUPID
INTRAVENOUS | Status: DC | PRN
  Administered 2017-09-30: 55.125 mg via INTRAVENOUS

## 2017-09-30 MED ORDER — CLOPIDOGREL BISULFATE 75 MG PO TABS
ORAL_TABLET | ORAL | Status: DC | PRN
  Administered 2017-09-30: 150 mg via ORAL

## 2017-09-30 MED ORDER — FENTANYL CITRATE (PF) 100 MCG/2ML IJ SOLN
INTRAMUSCULAR | Status: DC | PRN
  Administered 2017-09-30: 25 ug via INTRAVENOUS

## 2017-09-30 MED ORDER — ACETAMINOPHEN 325 MG PO TABS: 650.0000 mg | ORAL_TABLET | ORAL | Status: DC | PRN

## 2017-09-30 MED ORDER — LIDOCAINE HCL 2 % IJ SOLN
INTRAMUSCULAR | Status: DC | PRN
  Administered 2017-09-30: 2 mL

## 2017-09-30 MED ORDER — ANGIOPLASTY BOOK
Freq: Once | Status: AC
  Administered 2017-09-30: 21:00:00
  Filled 2017-09-30: qty 1

## 2017-09-30 MED ORDER — MIDAZOLAM HCL 2 MG/2ML IJ SOLN
INTRAMUSCULAR | Status: DC | PRN
  Administered 2017-09-30: 2 mg via INTRAVENOUS

## 2017-09-30 MED ORDER — HEPARIN SODIUM (PORCINE) 1000 UNIT/ML IJ SOLN
INTRAMUSCULAR | Status: AC
  Filled 2017-09-30: qty 1

## 2017-09-30 MED ORDER — SODIUM CHLORIDE 0.9 % WEIGHT BASED INFUSION
3.0000 mL/kg/h | INTRAVENOUS | Status: DC
  Administered 2017-09-30: 3 mL/kg/h via INTRAVENOUS

## 2017-09-30 MED ORDER — HEPARIN (PORCINE) IN NACL 2-0.9 UNIT/ML-% IJ SOLN
INTRAMUSCULAR | Status: AC
  Filled 2017-09-30: qty 1000

## 2017-09-30 SURGICAL SUPPLY — 24 items
BALLN SAPPHIRE 2.0X12 (BALLOONS) ×2
BALLN SAPPHIRE 2.5X12 (BALLOONS) ×2
BALLN ~~LOC~~ EUPHORA RX 3.0X15 (BALLOONS) ×2
BALLOON SAPPHIRE 2.0X12 (BALLOONS) IMPLANT
BALLOON SAPPHIRE 2.5X12 (BALLOONS) IMPLANT
BALLOON ~~LOC~~ EUPHORA RX 3.0X15 (BALLOONS) IMPLANT
CATH INFINITI 5FR ANG PIGTAIL (CATHETERS) ×1 IMPLANT
CATH OPTITORQUE TIG 4.0 5F (CATHETERS) ×1 IMPLANT
CATH VISTA GUIDE 6FR JR4 (CATHETERS) ×1 IMPLANT
CATH VISTA GUIDE 6FR XBRCA (CATHETERS) ×1 IMPLANT
DEVICE RAD COMP TR BAND LRG (VASCULAR PRODUCTS) ×1 IMPLANT
GLIDESHEATH SLEND SS 6F .021 (SHEATH) ×1 IMPLANT
GUIDEWIRE INQWIRE 1.5J.035X260 (WIRE) IMPLANT
INQWIRE 1.5J .035X260CM (WIRE) ×2
KIT ENCORE 26 ADVANTAGE (KITS) ×1 IMPLANT
KIT HEART LEFT (KITS) ×2 IMPLANT
PACK CARDIAC CATHETERIZATION (CUSTOM PROCEDURE TRAY) ×2 IMPLANT
STENT SIERRA 2.50 X 33 MM (Permanent Stent) ×1 IMPLANT
SYR MEDRAD MARK V 150ML (SYRINGE) ×2 IMPLANT
TRANSDUCER W/STOPCOCK (MISCELLANEOUS) ×2 IMPLANT
TUBING CIL FLEX 10 FLL-RA (TUBING) ×2 IMPLANT
WIRE ASAHI PROWATER 180CM (WIRE) ×1 IMPLANT
WIRE HI TORQ BMW 190CM (WIRE) ×1 IMPLANT
WIRE PT2 MS 185 (WIRE) ×1 IMPLANT

## 2017-09-30 NOTE — Care Management Note (Signed)
Case Management Note  Patient Details  Name: Craig Russell MRN: [id number] Date of Birth: 06/02/1946  Subjective/Objective:  From home, s/p coronary stent intervention, will be on plavix.                  Action/Plan: NCM will follow for dc needs.  Expected Discharge Date:                  Expected Discharge Plan:  Home/Self Care  In-House Referral:     Discharge planning Services  CM Consult  Post Acute Care Choice:    Choice offered to:     DME Arranged:    DME Agency:     HH Arranged:    [place] Agency:     Status of Service:  Completed, signed off  If discussed at [employer] of Stay Meetings, dates discussed:    Additional Comments:  Zenon Mayo, RN 09/30/2017, 1:12 PM

## 2017-09-30 NOTE — H&P (View-Only) (Signed)
Cardiology Office Note:    Date:  09/27/2017   ID:  Craig Russell, DOB October 03, 1946, MRN [id number]  PCP:  Melony Overly, MD  Cardiologist:  Shirlee More, MD    Referring MD: Melony Overly, MD    ASSESSMENT:    1. Coronary artery disease of native artery of native heart with stable angina pectoris ([place])   2. Essential hypertension   3. Cardiomyopathy, secondary ([place])   4. Hyperlipidemia, unspecified hyperlipidemia type    PLAN:    In order of problems listed above:  1. He has continued atypical chest pain with an abnormal myocardial perfusion study and will undergo left heart catheterization and revascularization if there is flow limiting stenosis, were now continue current treatment including long-term dual antiplatelet therapy aspirin and clopidogrel and is not on a beta blocker with resting bradycardia 2. Stable continue current treatment 3. Stable ejection fraction mildly reduced 4. Stable continue his statin   Next appointment: 4 Weeks   Medication Adjustments/Labs and Tests Ordered: Current medicines are reviewed at length with the patient today.  Concerns regarding medicines are outlined above.  Orders Placed This Encounter  Procedures  . DG Chest 2 View  . CBC with Differential/Platelet  . Basic metabolic panel  . Protime-INR   No orders of the defined types were placed in this encounter.   Chief Complaint  Patient presents with  . Follow-up    to discuss abnormal stress test   . Fatigue  . Shortness of Breath    History of Present Illness:    Craig Russell is a 71 y.o. male with a hx of syncope, CAD with remote PCI of RCA in 2003 and S/P PCI of LAD with DES 08/31/16  last seen one week ago. Recent myocardial perfusion study was read as intermediate risk with mild right coronary artery ischemia and mildly reduced ejection fraction. Unfortunately the patient is not doing well he has a vague diffuse precordial aching with and without activities  and has not needed to use nitroglycerin. Despite being able to exercise for greater than 9 Mets and a treadmill stress test he finds that he fatigues easily and has a diminished ability to do his work. He has not had typical angina palpitation or syncope but does have exertional shortness of breath. Because of his known CAD and previous revascularization of his right coronary artery in 2003 I asked him to undergo diagnostic coronary angiography and if he has flow limiting stenosis revascularization. He has no dye allergy benefits riskand options detailed and he has decided to undergo left heart catheterization.He is compliant with medications is on long-term dual antiplatelet therapy. Compliance with diet, lifestyle and medications:  yes Past Medical History:  Diagnosis Date  . Anxiety 09/13/2017  . CAD (coronary artery disease) 05/09/2016   Overview:  Coronary artery disease  s/p PCI and 2 stents RCA in 01/2002  And CTO of D IMPRESSION: Cardiolite at [facility] 05/11/16 1. No reversible ischemia or infarction. 2. LEFT ventricular dilatation with global hypokinesia. 3. Left ventricular ejection fraction 45% 4. Non invasive risk stratification*: Intermediate risk (EF 35-49%)  Cardiac cath Sept 12 2017: Conclusions Diagnostic Summary LM Mild luminal irregularities <20% LAD: Mid LAD 70--75% LCX : Mild luminal irregularities <20% RCA: Patent stent mid Vessel 30-40% intstent stenosis Distal RCA 40-50% LVEF: 55% FFR 0.68 mid LAD IFR < 0.9 Diagnostic Recommendations PCI to the LAD Interventional Summary Successful PCI to the Mid LAD with 3.25 x 23 mm DES stent; Post  dilated with 3.5 x 12 m Nicholson balloon Interventional Recommendations Dapt 6-12 months  . Cardiomyopathy, secondary ([place]) 08/31/2016   Overview:  EF was > 55% in 2010 by echo, normal LV size  . Chronic back pain   . Chronic heart failure ([place]) 08/31/2016  . Closed head injury 12/21/2016  . Community acquired pneumonia of right lower lobe of lung ([place]) 04/02/2017  .  Coronary artery disease    s/p 2 stents in 01/2002  . Coronary artery disease involving native coronary artery of native heart with angina pectoris ([place]) 08/31/2016   Overview:  Added automatically from request for surgery [id number]  . 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 Essential hypertension 04/02/2017  . GERD (gastroesophageal reflux disease) 09/13/2017  . HFrEF (heart failure with reduced ejection fraction) ([place]) 04/2016  . High cholesterol   . Hyperlipidemia 05/09/2016  . Hypokalemia 04/02/2017  . Insomnia 12/21/2016  . Intractable episodic cluster headache 12/21/2016  . Left anterior fascicular block 09/13/2017  . Noncompliance 09/13/2017  . Other complicated headache syndrome 12/21/2016  . S/P coronary artery stent placement 05/09/2016  . SVT (supraventricular tachycardia) ([place]) 08/31/2016  . Syncope 08/05/2016  . Tobacco abuse     Past Surgical History:  Procedure Laterality Date  . BACK SURGERY    . CARDIAC CATHETERIZATION  09/03/2016  . CATARACT EXTRACTION    . KNEE SURGERY    . SHOULDER SURGERY      Current Medications: Current Meds  Medication Sig  . albuterol (PROVENTIL HFA) 108 (90 Base) MCG/ACT inhaler Inhale 1-2 puffs into the lungs every 6 (six) hours as needed for wheezing or shortness of breath.  Marland Kitchen amitriptyline (ELAVIL) 25 MG tablet 1-4 a couple hours before bedtime  . aspirin 81 MG chewable tablet Chew 81 mg by mouth daily. Reported on 05/09/2016  . atorvastatin (LIPITOR) 40 MG tablet Take 1 tablet (40 mg total) by mouth daily at 6 PM.  . busPIRone (BUSPAR) 15 MG tablet Take 15 mg by mouth 2 (two) times daily. Reported on 05/09/2016   . CALCIUM-MAGNESIUM-ZINC PO Take 1 tablet by mouth every morning.  . clopidogrel (PLAVIX) 75 MG tablet Take 75 mg by mouth daily.  . nicotine (NICODERM CQ - DOSED IN MG/24 HOURS) 21 mg/24hr patch Place 1 patch (21 mg total) onto the skin daily. (Patient taking differently: Place 21 mg onto the skin daily as needed  (nicotine dependence). )  . nitroGLYCERIN (NITROSTAT) 0.4 MG SL tablet Place 0.4 mg under the tongue every 5 (five) minutes as needed for chest pain. Reported on 05/09/2016  . pantoprazole (PROTONIX) 40 MG tablet Take 1 tablet (40 mg total) by mouth daily.     Allergies:   Patient has no known allergies.   Social History   Social History  . Marital status: Married    Spouse name: N/A  . Number of children: N/A  . Years of education: N/A   Occupational History  . part-time for [employer]    Social History Main Topics  . Smoking status: Current Every Day Smoker    Packs/day: 0.50    Years: 60.00    Types: Cigarettes  . Smokeless tobacco: Never Used  . Alcohol use No  . Drug use: No  . Sexual activity: Yes   Other Topics Concern  . 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 ROS:   Please see the history of present illness.    All other systems reviewed and are negative.  EKGs/Labs/Other Studies Reviewed:    The following studies were reviewed today:  EKG:  09/19/17 [place] LAFB otherwise normal MPI 09/21/17:  Study Highlights    Response to Stress There was no ST segment deviation noted during stress at 9.3 Mets  Arrhythmias during stress: none.  Arrhythmias during recovery: none.  There were no significant arrhythmias noted during the test.  ECG was interpretable and there was no significant change from baseline.     Nuclear stress EF: 49%.  Blood pressure demonstrated a normal response to exercise.  There was no ST segment deviation noted during stress.  This is an intermediate risk study.  The left ventricular ejection fraction is mildly decreased (45-54%).   1. EF 49%, diffuse hypokinesis.  2. Reversible  small, mild basal inferior perfusion defect.  Possible small area of ischemia.   Intermediate risk study primarily due to low EF.     Recent Labs: 04/01/2017: ALT 15 04/03/2017: BUN 10; Creatinine, Ser 0.95; Hemoglobin 12.5; Platelets 219; Potassium 3.6; Sodium 137  Recent Lipid Panel    Component Value Date/Time   CHOL 172 05/10/2016 0032   TRIG 78 05/10/2016 0032   HDL 48 05/10/2016 0032   CHOLHDL 3.6 05/10/2016 0032   VLDL 16 05/10/2016 0032   LDLCALC 108 (H) 05/10/2016 0032    Physical Exam:    VS:  BP 134/80 (BP Location: Right Arm, Patient Position: Sitting)   Pulse 70   Ht 5\' 7"  (1.702 m)   Wt 161 lb 1.9 oz (73.1 kg)   SpO2 98%   BMI 25.23 kg/m     Wt Readings from Last 3 Encounters:  09/27/17 161 lb 1.9 oz (73.1 kg)  09/19/17 164 lb 1.9 oz (74.4 kg)  04/01/17 165 lb (74.8 kg)     GEN:   Well nourished, well developed in no acute distress HEENT: Normal NECK: No JVD; No carotid bruits LYMPHATICS: No lymphadenopathy CARDIAC:  RRR, no murmurs, rubs, gallops RESPIRATORY:  Clear to auscultation without rales, wheezing or rhonchi  ABDOMEN: Soft, non-tender, non-distended MUSCULOSKELETAL:  No edema; No deformity  SKIN: Warm and dry NEUROLOGIC:  Alert and oriented x 3 PSYCHIATRIC:  Normal affect    Signed, Shirlee More, MD  09/27/2017 10:15 AM    [place]

## 2017-09-30 NOTE — Progress Notes (Signed)
Confirmed that patient received entire bag of Angiomax. Will discontinue consult.  Harvel Quale  09/30/2017 1:41 PM

## 2017-09-30 NOTE — Interval H&P Note (Signed)
Cath Lab Visit (complete for each Cath Lab visit)  Clinical Evaluation Leading to the Procedure:   ACS: No.  Non-ACS:    Anginal Classification: CCS II  Anti-ischemic medical therapy: No Therapy  Non-Invasive Test Results: Intermediate-risk stress test findings: cardiac mortality 1-3%/year  Prior CABG: No previous CABG      History and Physical Interval Note:  09/30/2017 9:54 AM  Craig Russell  has presented today for surgery, with the diagnosis of abn stress   cad  The various methods of treatment have been discussed with the patient and family. After consideration of risks, benefits and other options for treatment, the patient has consented to  Procedure(s): LEFT HEART CATH AND CORONARY ANGIOGRAPHY (N/A) as a surgical intervention .  The patient's history has been reviewed, patient examined, no change in status, stable for surgery.  I have reviewed the patient's chart and labs.  Questions were answered to the patient's satisfaction.     Shelva Majestic

## 2017-10-01 DIAGNOSIS — I25110 Atherosclerotic heart disease of native coronary artery with unstable angina pectoris: Secondary | ICD-10-CM | POA: Diagnosis not present

## 2017-10-01 DIAGNOSIS — F17210 Nicotine dependence, cigarettes, uncomplicated: Secondary | ICD-10-CM | POA: Diagnosis not present

## 2017-10-01 DIAGNOSIS — I5022 Chronic systolic (congestive) heart failure: Secondary | ICD-10-CM | POA: Diagnosis not present

## 2017-10-01 DIAGNOSIS — T82855A Stenosis of coronary artery stent, initial encounter: Secondary | ICD-10-CM | POA: Diagnosis not present

## 2017-10-01 DIAGNOSIS — E785 Hyperlipidemia, unspecified: Secondary | ICD-10-CM | POA: Diagnosis not present

## 2017-10-01 DIAGNOSIS — I110 Hypertensive heart disease with heart failure: Secondary | ICD-10-CM | POA: Diagnosis not present

## 2017-10-01 DIAGNOSIS — I200 Unstable angina: Secondary | ICD-10-CM

## 2017-10-01 DIAGNOSIS — Z79899 Other long term (current) drug therapy: Secondary | ICD-10-CM | POA: Diagnosis not present

## 2017-10-01 DIAGNOSIS — I429 Cardiomyopathy, unspecified: Secondary | ICD-10-CM | POA: Diagnosis not present

## 2017-10-01 DIAGNOSIS — Z7902 Long term (current) use of antithrombotics/antiplatelets: Secondary | ICD-10-CM | POA: Diagnosis not present

## 2017-10-01 DIAGNOSIS — I25118 Atherosclerotic heart disease of native coronary artery with other forms of angina pectoris: Secondary | ICD-10-CM | POA: Diagnosis not present

## 2017-10-01 DIAGNOSIS — Z7982 Long term (current) use of aspirin: Secondary | ICD-10-CM | POA: Diagnosis not present

## 2017-10-01 LAB — BASIC METABOLIC PANEL
ANION GAP: 6 (ref 5–15)
BUN: 11 mg/dL (ref 6–20)
CHLORIDE: 108 mmol/L (ref 101–111)
CO2: 23 mmol/L (ref 22–32)
Calcium: 8.2 mg/dL — ABNORMAL LOW (ref 8.9–10.3)
Creatinine, Ser: 1.04 mg/dL (ref 0.61–1.24)
GFR calc Af Amer: >60 mL/min (ref >60)
GFR calc non Af Amer: >60 mL/min (ref >60)
GLUCOSE: 104 mg/dL — AB (ref 65–99)
POTASSIUM: 3.6 mmol/L (ref 3.5–5.1)
Sodium: 137 mmol/L (ref 135–145)

## 2017-10-01 LAB — CBC
HEMATOCRIT: 36.6 % — AB (ref 39.0–52.0)
HEMOGLOBIN: 12.2 g/dL — AB (ref 13.0–17.0)
MCH: 29.9 pg (ref 26.0–34.0)
MCHC: 33.3 g/dL (ref 30.0–36.0)
MCV: 89.7 fL (ref 78.0–100.0)
Platelets: 157 10*3/uL (ref 150–400)
RBC: 4.08 MIL/uL — ABNORMAL LOW (ref 4.22–5.81)
RDW: 14.5 % (ref 11.5–15.5)
WBC: 7.4 10*3/uL (ref 4.0–10.5)

## 2017-10-01 MED ORDER — ATORVASTATIN CALCIUM 80 MG PO TABS
80.0000 mg | ORAL_TABLET | Freq: Every day | ORAL | 3 refills | Status: AC
Start: 2017-10-01 — End: ?

## 2017-10-01 NOTE — Progress Notes (Signed)
CARDIAC REHAB PHASE I   PRE:  Rate/Rhythm: 68 SR  BP:  Supine: 137/80 Sitting:   Standing:    SaO2: 96% RA  MODE:  Ambulation: 600 ft   POST:  Rate/Rhythm: 82 SR  BP:  Supine:   Sitting: 137/87  Standing:    SaO2: 97% RA  [id number] Patient tolerated ambulation well with assist x1, gait steady, no c/o, VSS. Completed PCI/stent education with patient including restrictions, CP, NTG use, and calling 911, risk factor modification including heart healthy diet, tobacco cessation and activity progression. Pt needs encouragement/reinforcement with risk factor modification. Pt verbalizes understanding of instructions given. Discussed Phase 2 cardiac rehab, and permission given to send contact information to CR program at [facility] in [place], [place].  Sol Passer, MS, ACSM CEP

## 2017-10-01 NOTE — Discharge Summary (Signed)
Discharge Summary    Patient ID: Craig Russell,  MRN: [id number], DOB/AGE: [birth year] 71 y.o.  Admit date: 09/30/2017 Discharge date: 10/01/2017  Primary Care Provider: Quintella Reichert B Primary Cardiologist: Dr. Bettina Gavia  Discharge Diagnoses    Principal Problem:   CAD S/P percutaneous coronary angioplasty Active Problems:   Tobacco abuse   Hyperlipidemia   Unstable angina [facility])   History of Present Illness     Craig Russell is a 71 y.o. male with past medical history of CAD (s/p PCI of RCA in 2003 and DES to LAD in 08/2016), HTN, HLD, pSVT, and tobacco use who was recently evaluated by Dr. Bettina Gavia and reported having worsening chest pain with activity. A NST was performed on 09/21/2017 and showed a reversible mild basal inferior defect consistent with ischemia. With his history of recent stent placement and worsening symptoms, a repeat cardiac catheterization was recommended and he presented to [facility] on 09/30/2017 for the procedure.    Hospital Course     Consultants: None   Cardiac catheterization was performed and showed a patent stent along the LAD with 10% luminal irregularity, 50% distal LAD stenosis, 30% proximal left circumflex stenosis and 40-50% RCA stenosis with 95% focal in-stent restenosis in the stent at the acute margin followed by 80% stenosis beyond the distal RCA stent. Successful PCI was performed with placement of 2.533 mm Xience Sierra DES to the distal RCA stenoses and PTCA of the proximal stent which had narrowed to at least 60% during the procedure being reduced to 25%. He will continue on DAPT with ASA and Plavix along with statin therapy (dosing increased from 40mg  daily to 80mg  daily). He is not on BB therapy secondary to baseline bradycardia.   Overnight, telemetry showed sinus bradycardia, HR in the 50's with 4 beats NSVT. Labs with stable Hgb of 12.2 and creatinine 1.04. He reported feeling well and denied any recurrent anginal symptoms. Right  radial cath site was stable without ecchymosis or evidence of a hematoma. He has reduced his tobacco use from 2 ppd to 1 ppd with complete cessation advised.    He was last examined by Dr. Claiborne Billings and deemed stable for discharge. Follow-Up with Dr. Bettina Gavia has been scheduled.   _____________  Discharge Vitals and Physical Examination Blood pressure 137/80, pulse 64, temperature 98.5 F (36.9 C), temperature source Oral, resp. rate 18, height 5\' 7"  (1.702 m), weight 166 lb (75.3 kg), SpO2 95 %.  Filed Weights   09/30/17 0753 10/01/17 0320  Weight: 162 lb (73.5 kg) 166 lb (75.3 kg)   General: Pleasant Caucasian male appearing in NAD. Psych: Normal affect. Neuro: Alert and oriented X 3. Moves all extremities spontaneously. HEENT: Normal  Neck: Supple without bruits or JVD. Lungs:  Resp regular and unlabored, CTA without wheezing or rales. Heart: RRR no s3, s4, or murmurs. Abdomen: Soft, non-tender, non-distended, BS + x 4.  Extremities: No clubbing, cyanosis or edema. DP/PT/Radials 2+ and equal bilaterally. Right radial cath site without ecchymosis or evidence of a hematoma.    Labs & Radiologic Studies     CBC  Recent Labs  10/01/17 0232  WBC 7.4  HGB 12.2*  HCT 36.6*  MCV 89.7  PLT 831   Basic Metabolic Panel  Recent Labs  10/01/17 0232  NA 137  K 3.6  CL 108  CO2 23  GLUCOSE 104*  BUN 11  CREATININE 1.04  CALCIUM 8.2*   Liver Function Tests No results for input(s): AST,  ALT, ALKPHOS, BILITOT, PROT, ALBUMIN in the last 72 hours. No results for input(s): LIPASE, AMYLASE in the last 72 hours. Cardiac Enzymes No results for input(s): CKTOTAL, CKMB, CKMBINDEX, TROPONINI in the last 72 hours. BNP Invalid input(s): POCBNP D-Dimer No results for input(s): DDIMER in the last 72 hours. Hemoglobin A1C No results for input(s): HGBA1C in the last 72 hours. Fasting Lipid Panel No results for input(s): CHOL, HDL, LDLCALC, TRIG, CHOLHDL, LDLDIRECT in the last 72  hours. Thyroid Function Tests No results for input(s): TSH, T4TOTAL, T3FREE, THYROIDAB in the last 72 hours.  Invalid input(s): FREET3  Dg Chest 2 View  Result Date: 09/27/2017 CLINICAL DATA:  71 year old male with a history of pending heart catheterization. No current complaints EXAM: CHEST  2 VIEW COMPARISON:  04/01/2017, 08/31/2016 FINDINGS: Cardiomediastinal silhouette unchanged in size and contour. No evidence of central vascular congestion. Calcifications of the aortic arch. No confluent airspace disease.  No pleural effusion. No displaced fracture IMPRESSION: No radiographic evidence of acute cardiopulmonary disease Electronically Signed   By: Corrie Mckusick D.O.   On: 09/27/2017 14:59     Diagnostic Studies/Procedures     Cardiac Catheterization: 09/30/2017  Prox LAD lesion, 10 %stenosed.  Dist LAD lesion, 50 %stenosed.  Prox Cx lesion, 30 %stenosed.  Dist RCA lesion, 95 %stenosed.  Post intervention, there is a 0% residual stenosis.  A stent was successfully placed.  Post Atrio lesion, 80 %stenosed.  Post intervention, there is a 0% residual stenosis.  A stent was successfully placed.  Ost RCA to Prox RCA lesion, 50 %stenosed.  Post intervention, there is a 25% residual stenosis.  The left ventricular systolic function is normal.  LV end diastolic pressure is normal.   Preserved global LV function with an ejection fraction of 50-55% with a possible subtle region of mid to basal focal hypocontractility.  Coronary obstructive disease with a patent stent in the proximal LAD with smooth intimal hyperplasia of 10%, diffuse luminal irregularity of the first diagonal vessel, and 50% distal LAD stenosis; 30% proximal left circumflex stenosis; and a large dominant RCA with previous proximal stent and a tortuous segment with focal mid stent.  Narrowing of 40-50% initially, and 95% focal in-stent restenosis in the stent at the acute margin followed by 80% stenosis beyond  the distal RCA stent.  Difficult but successful PCI with backup difficulties, vessel tortuosity, but ultimately successful PTCA, and stenting of the 2 tandem distal RCA stenoses with insertion of a 2.533 mm Xience Sierra DES stent postdilated to 2.9 mm and the 95% and 80% stenoses being reduced to 0%; and PTCA of the proximal stent which had narrowed to at least 60% during the procedure being reduced to 25% with noncompliant balloon dilatation dilated up to 3.06 mm.  RECOMMENDATION: DAPT therapy should be continued.  Medical therapy for concomitant CAD.  Aggressive lipid-lowering therapy and blood pressure control.  The patient will follow-up with Dr. Shirlee More.     Disposition   Pt is being discharged home today in good condition.  Follow-up Plans & Appointments    Follow-up Information    Richardo Priest, MD Follow up on 10/25/2017.   Specialty:  Cardiology Why:  Cardiology Follow-Up on 10/25/2017 at 8:20AM.  Contact information: [place] [address] [phone]          Discharge Instructions    Diet - low sodium heart healthy    Complete by:  As directed    Discharge instructions  Complete by:  As directed    PLEASE REMEMBER TO BRING ALL OF YOUR MEDICATIONS TO EACH OF YOUR FOLLOW-UP OFFICE VISITS.  PLEASE ATTEND ALL SCHEDULED FOLLOW-UP APPOINTMENTS.   Activity: Increase activity slowly as tolerated. You may shower, but no soaking baths (or swimming) for 1 week. No driving for 24 hours. No lifting over 5 lbs for 1 week. No sexual activity for 1 week.   You May Return to Work: in 1 week (if applicable)  Wound Care: You may wash cath site gently with soap and water. Keep cath site clean and dry. If you notice pain, swelling, bleeding or pus at your cath site, please call [phone].   Increase activity slowly    Complete by:  As directed       Discharge Medications     Medication List    TAKE these medications    amitriptyline 25 MG tablet Commonly known as:  ELAVIL Take 25 mg by mouth before bedtime   aspirin 81 MG chewable tablet Chew 81 mg by mouth daily. Reported on 05/09/2016   atorvastatin 80 MG tablet Commonly known as:  LIPITOR Take 1 tablet (80 mg total) by mouth daily at 6 PM. What changed:  medication strength  how much to take   busPIRone 15 MG tablet Commonly known as:  BUSPAR Take 15 mg by mouth 2 (two) times daily. Reported on 05/09/2016   CALCIUM-MAGNESIUM-ZINC PO Take 1 tablet by mouth every morning.   clopidogrel 75 MG tablet Commonly known as:  PLAVIX Take 75 mg by mouth daily.   nicotine 21 mg/24hr patch Commonly known as:  NICODERM CQ - dosed in mg/24 hours Place 1 patch (21 mg total) onto the skin daily. What changed:  when to take this  reasons to take this   nitroGLYCERIN 0.4 MG SL tablet Commonly known as:  NITROSTAT Place 0.4 mg under the tongue every 5 (five) minutes as needed for chest pain. Reported on 05/09/2016   pantoprazole 40 MG tablet Commonly known as:  PROTONIX Take 1 tablet (40 mg total) by mouth daily.   PROVENTIL HFA 108 (90 Base) MCG/ACT inhaler Generic drug:  albuterol Inhale 1-2 puffs into the lungs every 6 (six) hours as needed for wheezing or shortness of breath.       Aspirin prescribed at discharge?  Yes High Intensity Statin Prescribed? (Lipitor 40-80mg  or Crestor 20-40mg ): Yes Beta Blocker Prescribed? No: Bradycardia For EF 40% or less, Was ACEI/ARB Prescribed? No: EF > 40% ADP Receptor Inhibitor Prescribed? (i.e. Plavix etc.-Includes Medically Managed Patients): Yes For EF <40%, Aldosterone Inhibitor Prescribed? No: EF > 40% Was EF assessed during THIS hospitalization? Yes Was Cardiac Rehab II ordered? (Included Medically managed Patients): Yes   Allergies No Known Allergies   Outstanding Labs/Studies   None  Duration of Discharge Encounter   Greater than 30 minutes including physician  time.  Signed, Erma Heritage, PA-C 10/01/2017, 8:00 AM   Patient seen and examined. Agree with assessment and plan. Feels well. Reviewed cath/PCI findings.  S/p DES stent of instent distal  RCA stenosis and more distal lesion and PTCA in proximal RCA stent.  Ambulated well this am; no chest pain. Radial cath site stable.  Will increase atorvastatin to 80 mg,  Consider adding low dose bb if HR allows.  Discussed smoking cessation. DC today; f/u with Dr.Munley.   Troy Sine, MD, [facility] 10/01/2017 8:05 AM

## 2017-10-04 DIAGNOSIS — Z0000 Encounter for general adult medical examination without abnormal findings: Secondary | ICD-10-CM | POA: Diagnosis not present

## 2017-10-04 DIAGNOSIS — Z125 Encounter for screening for malignant neoplasm of prostate: Secondary | ICD-10-CM | POA: Diagnosis not present

## 2017-10-04 DIAGNOSIS — Z1211 Encounter for screening for malignant neoplasm of colon: Secondary | ICD-10-CM | POA: Diagnosis not present

## 2017-10-04 DIAGNOSIS — Z1389 Encounter for screening for other disorder: Secondary | ICD-10-CM | POA: Diagnosis not present

## 2017-10-04 DIAGNOSIS — Z23 Encounter for immunization: Secondary | ICD-10-CM | POA: Diagnosis not present

## 2017-10-04 DIAGNOSIS — E785 Hyperlipidemia, unspecified: Secondary | ICD-10-CM | POA: Diagnosis not present

## 2017-10-04 DIAGNOSIS — Z136 Encounter for screening for cardiovascular disorders: Secondary | ICD-10-CM | POA: Diagnosis not present

## 2017-10-04 DIAGNOSIS — Z79899 Other long term (current) drug therapy: Secondary | ICD-10-CM | POA: Diagnosis not present

## 2017-10-04 DIAGNOSIS — I119 Hypertensive heart disease without heart failure: Secondary | ICD-10-CM | POA: Diagnosis not present

## 2017-10-12 DIAGNOSIS — I2510 Atherosclerotic heart disease of native coronary artery without angina pectoris: Secondary | ICD-10-CM | POA: Diagnosis not present

## 2017-10-12 DIAGNOSIS — E785 Hyperlipidemia, unspecified: Secondary | ICD-10-CM | POA: Diagnosis not present

## 2017-10-12 DIAGNOSIS — F411 Generalized anxiety disorder: Secondary | ICD-10-CM | POA: Diagnosis not present

## 2017-10-12 DIAGNOSIS — Z955 Presence of coronary angioplasty implant and graft: Secondary | ICD-10-CM | POA: Diagnosis not present

## 2017-10-12 DIAGNOSIS — F17210 Nicotine dependence, cigarettes, uncomplicated: Secondary | ICD-10-CM | POA: Diagnosis not present

## 2017-10-24 NOTE — Progress Notes (Signed)
Cardiology Office Note:    Date:  10/25/2017   ID:  Craig Russell, DOB 1946/05/07, MRN [id number]  PCP:  Melony Overly, MD  Cardiologist:  Shirlee More, MD    Referring MD: Melony Overly, MD    ASSESSMENT:    1. Coronary artery disease of native artery of native heart with stable angina pectoris ([place])   2. Essential hypertension   3. Hyperlipidemia, unspecified hyperlipidemia type    PLAN:    In order of problems listed above:  1. Stable, continue long-term dual antiplatelet therapy and current medical treatment and I strongly encouraged and counseled him to stop smoking with repeated PCI 2. Stable at target continue current medical treatment 3. Stable continue high intensity statin   Next appointment: 6 months   Medication Adjustments/Labs and Tests Ordered: Current medicines are reviewed at length with the patient today.  Concerns regarding medicines are outlined above.  No orders of the defined types were placed in this encounter.  No orders of the defined types were placed in this encounter.   Chief Complaint  Patient presents with  . Follow-up    recent PCI and stent   . Coronary Artery Disease    History of Present Illness:    Craig Russell is a 71 y.o. male with a hx of syncope, CAD with remote PCI of RCA in 2003 and S/P PCI of LAD with DES 08/31/16  one month ago last seen. Compliance with diet, lifestyle and medications: Yes but he continues to smoke and is not ready to stop He is improved quickly returned to work has had no angina and had one episode of brief momentary point localized left chest discomfort.  No bleeding complications on dual antiplatelet he has exertional shortness of breath due to underlying lung disease no palpitation or syncope Past Medical History:  Diagnosis Date  . Anxiety 09/13/2017  . CAD (coronary artery disease) 05/09/2016   Overview:  Coronary artery disease  s/p PCI and 2 stents RCA in 01/2002  And CTO of D  IMPRESSION: Cardiolite at [facility] 05/11/16 1. No reversible ischemia or infarction. 2. LEFT ventricular dilatation with global hypokinesia. 3. Left ventricular ejection fraction 45% 4. Non invasive risk stratification*: Intermediate risk (EF 35-49%)  Cardiac cath Sept 12 2017: Conclusions Diagnostic Summary LM Mild luminal irregularities <20% LAD: Mid LAD 70--75% LCX : Mild luminal irregularities <20% RCA: Patent stent mid Vessel 30-40% intstent stenosis Distal RCA 40-50% LVEF: 55% FFR 0.68 mid LAD IFR < 0.9 Diagnostic Recommendations PCI to the LAD Interventional Summary Successful PCI to the Mid LAD with 3.25 x 23 mm DES stent; Post dilated with 3.5 x 12 m  balloon Interventional Recommendations Dapt 6-12 months  . Cardiomyopathy, secondary ([place]) 08/31/2016   Overview:  EF was > 55% in 2010 by echo, normal LV size  . Chronic back pain   . Chronic heart failure ([place]) 08/31/2016  . Closed head injury 12/21/2016  . Community acquired pneumonia of right lower lobe of lung ([place]) 04/02/2017  . Coronary artery disease    s/p 2 stents in 01/2002  . Coronary artery disease involving native coronary artery of native heart with angina pectoris ([place]) 08/31/2016   Overview:  Added automatically from request for surgery [id number]  . 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 Dyspnea   . Essential hypertension 04/02/2017  . GERD (gastroesophageal reflux disease) 09/13/2017  . HFrEF (heart failure with reduced ejection fraction) ([place]) 04/2016  . High cholesterol   .  Hyperlipidemia 05/09/2016  . Hypokalemia 04/02/2017  . Insomnia 12/21/2016  . Intractable episodic cluster headache 12/21/2016  . Left anterior fascicular block 09/13/2017  . Noncompliance 09/13/2017  . Other complicated headache syndrome 12/21/2016  . S/P coronary artery stent placement 05/09/2016  . SVT (supraventricular tachycardia) ([place]) 08/31/2016  . Syncope 08/05/2016  . Tobacco abuse     Past Surgical History:  Procedure Laterality Date    . BACK SURGERY    . CARDIAC CATHETERIZATION  09/03/2016  . CATARACT EXTRACTION    . CORONARY STENT INTERVENTION  09/29/2017  . KNEE SURGERY    . SHOULDER SURGERY      Current Medications: Current Meds  Medication Sig  . albuterol (PROVENTIL HFA) 108 (90 Base) MCG/ACT inhaler Inhale 1-2 puffs into the lungs every 6 (six) hours as needed for wheezing or shortness of breath.  Marland Kitchen amitriptyline (ELAVIL) 25 MG tablet Take 50 mg at night prn  . aspirin 81 MG chewable tablet Chew 81 mg by mouth daily. Reported on 05/09/2016  . atorvastatin (LIPITOR) 80 MG tablet Take 1 tablet (80 mg total) by mouth daily at 6 PM.  . busPIRone (BUSPAR) 15 MG tablet Take 15 mg by mouth 2 (two) times daily. Reported on 05/09/2016   . CALCIUM-MAGNESIUM-ZINC PO Take 1 tablet by mouth every morning.  . clopidogrel (PLAVIX) 75 MG tablet Take 75 mg by mouth daily.  . nitroGLYCERIN (NITROSTAT) 0.4 MG SL tablet Place 0.4 mg under the tongue every 5 (five) minutes as needed for chest pain. Reported on 05/09/2016  . pantoprazole (PROTONIX) 40 MG tablet Take 1 tablet (40 mg total) by mouth daily.     Allergies:   Patient has no known allergies.   Social History   Socioeconomic History  . Marital status: Married    Spouse name: None  . Number of children: None  . Years of education: None  . Highest education level: None  Social Needs  . Financial resource strain: None  . Food insecurity - worry: None  . Food insecurity - inability: None  . Transportation needs - medical: None  . Transportation needs - non-medical: None  Occupational History  . Occupation: part-time for [employer]  . Smoking status: Current Every Day Smoker    Packs/day: 0.50    Years: 60.00    Pack years: 30.00    Types: Cigarettes  . Smokeless tobacco: Never Used  Substance and Sexual Activity  . Alcohol use: No    Alcohol/week: 0.0 oz  . Drug use: No  . Sexual activity: Yes  Other Topics Concern  . 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 ROS:   Please see the history of present illness.    All other systems reviewed and are negative.  EKGs/Labs/Other Studies Reviewed:    The following studies were reviewed today:  Left heart cath 09/30/18: Prox LAD lesion, 10 %stenosed.  Dist LAD lesion, 50 %stenosed.  Prox Cx lesion, 30 %stenosed.  Dist RCA lesion, 95 %stenosed.  Post intervention, there is a 0% residual stenosis.  A stent was successfully placed.  Post Atrio lesion, 80 %stenosed.  Post intervention, there is a 0% residual stenosis.  A stent was successfully placed.  Ost RCA  to Prox RCA lesion, 50 %stenosed.  Post intervention, there is a 25% residual stenosis.  The left ventricular systolic function is normal.          LV end diastolic pressure is normal. Difficult but successful PCI with backup difficulties, vessel tortuosity, but ultimately successful PTCA, and stenting of the 2 tandem distal RCA stenoses with insertion of a 2.533 mm Xience Sierra DES stent postdilated to 2.9 mm and the 95% and 80% stenoses being reduced to 0%; and PTCA of the proximal stent which had narrowed to at least 60% during the procedure being reduced to 25% with noncompliant balloon dilatation dilated up to 3.06 mm.   Recent Labs: 04/01/2017: ALT 15 10/01/2017: BUN 11; Creatinine, Ser 1.04; Hemoglobin 12.2; Platelets 157; Potassium 3.6; Sodium 137  Recent Lipid Panel    Component Value Date/Time   CHOL 172 05/10/2016 0032   TRIG 78 05/10/2016 0032   HDL 48 05/10/2016 0032   CHOLHDL 3.6 05/10/2016 0032   VLDL 16 05/10/2016 0032   LDLCALC 108 (H) 05/10/2016 0032    Physical Exam:    VS:  BP 130/86 (BP Location: Right Arm, Patient Position: Sitting,  Cuff Size: Normal)   Pulse 85   Ht 5\' 7"  (1.702 m)   Wt 166 lb (75.3 kg)   SpO2 97%   BMI 26.00 kg/m     Wt Readings from Last 3 Encounters:  10/25/17 166 lb (75.3 kg)  10/01/17 166 lb (75.3 kg)  09/27/17 161 lb 1.9 oz (73.1 kg)     GEN:  Well nourished, well developed in no acute distress HEENT: Normal NECK: No JVD; No carotid bruits LYMPHATICS: No lymphadenopathy CARDIAC: RRR, no murmurs, rubs, gallops RESPIRATORY:  Clear to auscultation without rales, wheezing or rhonchi  ABDOMEN: Soft, non-tender, non-distended MUSCULOSKELETAL:  No edema; No deformity  SKIN: Warm and dry NEUROLOGIC:  Alert and oriented x 3 PSYCHIATRIC:  Normal affect    Signed, Shirlee More, MD  10/25/2017 8:27 AM    [place]

## 2017-10-25 ENCOUNTER — Ambulatory Visit (INDEPENDENT_AMBULATORY_CARE_PROVIDER_SITE_OTHER): Payer: Medicare Other | Admitting: Cardiology

## 2017-10-25 ENCOUNTER — Encounter: Payer: Self-pay | Admitting: Cardiology

## 2017-10-25 VITALS — BP 130/86 | HR 85 | Ht 67.0 in | Wt 166.0 lb

## 2017-10-25 DIAGNOSIS — E785 Hyperlipidemia, unspecified: Secondary | ICD-10-CM

## 2017-10-25 DIAGNOSIS — I10 Essential (primary) hypertension: Secondary | ICD-10-CM | POA: Diagnosis not present

## 2017-10-25 DIAGNOSIS — I25118 Atherosclerotic heart disease of native coronary artery with other forms of angina pectoris: Secondary | ICD-10-CM

## 2017-10-25 MED ORDER — ALBUTEROL SULFATE HFA 108 (90 BASE) MCG/ACT IN AERS: 1.0000 | INHALATION_SPRAY | Freq: Four times a day (QID) | RESPIRATORY_TRACT | 1 refills | Status: AC | PRN

## 2017-10-25 NOTE — Patient Instructions (Signed)

## 2017-12-06 ENCOUNTER — Other Ambulatory Visit: Payer: Self-pay

## 2017-12-06 MED ORDER — CLOPIDOGREL BISULFATE 75 MG PO TABS: 75.0000 mg | ORAL_TABLET | Freq: Every day | ORAL | 3 refills | Status: AC

## 2018-01-05 DIAGNOSIS — F17210 Nicotine dependence, cigarettes, uncomplicated: Secondary | ICD-10-CM | POA: Diagnosis not present

## 2018-01-05 DIAGNOSIS — R7309 Other abnormal glucose: Secondary | ICD-10-CM | POA: Diagnosis not present

## 2018-01-05 DIAGNOSIS — Z955 Presence of coronary angioplasty implant and graft: Secondary | ICD-10-CM | POA: Diagnosis not present

## 2018-01-05 DIAGNOSIS — E785 Hyperlipidemia, unspecified: Secondary | ICD-10-CM | POA: Diagnosis not present

## 2018-01-05 DIAGNOSIS — F411 Generalized anxiety disorder: Secondary | ICD-10-CM | POA: Diagnosis not present

## 2018-01-05 DIAGNOSIS — I119 Hypertensive heart disease without heart failure: Secondary | ICD-10-CM | POA: Diagnosis not present

## 2018-01-05 DIAGNOSIS — I2510 Atherosclerotic heart disease of native coronary artery without angina pectoris: Secondary | ICD-10-CM | POA: Diagnosis not present

## 2018-01-05 DIAGNOSIS — E663 Overweight: Secondary | ICD-10-CM | POA: Diagnosis not present

## 2018-01-05 DIAGNOSIS — I471 Supraventricular tachycardia: Secondary | ICD-10-CM | POA: Diagnosis not present

## 2018-01-05 DIAGNOSIS — I472 Ventricular tachycardia: Secondary | ICD-10-CM | POA: Diagnosis not present

## 2018-03-06 DIAGNOSIS — M545 Low back pain: Secondary | ICD-10-CM | POA: Diagnosis not present

## 2018-03-06 DIAGNOSIS — M25562 Pain in left knee: Secondary | ICD-10-CM | POA: Diagnosis not present

## 2018-03-06 DIAGNOSIS — M25552 Pain in left hip: Secondary | ICD-10-CM | POA: Diagnosis not present

## 2018-03-27 DIAGNOSIS — M545 Low back pain: Secondary | ICD-10-CM | POA: Diagnosis not present

## 2018-03-27 DIAGNOSIS — N3001 Acute cystitis with hematuria: Secondary | ICD-10-CM | POA: Diagnosis not present

## 2018-04-22 DIAGNOSIS — J0100 Acute maxillary sinusitis, unspecified: Secondary | ICD-10-CM | POA: Diagnosis not present

## 2018-05-30 DIAGNOSIS — E782 Mixed hyperlipidemia: Secondary | ICD-10-CM | POA: Diagnosis not present

## 2018-05-30 DIAGNOSIS — J410 Simple chronic bronchitis: Secondary | ICD-10-CM | POA: Diagnosis not present

## 2018-05-30 DIAGNOSIS — R413 Other amnesia: Secondary | ICD-10-CM | POA: Diagnosis not present

## 2018-05-30 DIAGNOSIS — R400 Somnolence: Secondary | ICD-10-CM | POA: Diagnosis not present

## 2018-05-30 DIAGNOSIS — I2510 Atherosclerotic heart disease of native coronary artery without angina pectoris: Secondary | ICD-10-CM | POA: Diagnosis not present

## 2018-05-30 DIAGNOSIS — K219 Gastro-esophageal reflux disease without esophagitis: Secondary | ICD-10-CM | POA: Diagnosis not present

## 2018-05-30 DIAGNOSIS — R5383 Other fatigue: Secondary | ICD-10-CM | POA: Diagnosis not present

## 2018-05-30 DIAGNOSIS — F411 Generalized anxiety disorder: Secondary | ICD-10-CM | POA: Diagnosis not present

## 2018-05-30 DIAGNOSIS — F17210 Nicotine dependence, cigarettes, uncomplicated: Secondary | ICD-10-CM | POA: Diagnosis not present

## 2018-05-31 DIAGNOSIS — R413 Other amnesia: Secondary | ICD-10-CM | POA: Diagnosis not present

## 2018-05-31 DIAGNOSIS — E782 Mixed hyperlipidemia: Secondary | ICD-10-CM | POA: Diagnosis not present

## 2018-07-20 DEATH — deceased
